# Patient Record
Sex: Female | Born: 1945
Health system: Southern US, Community
[De-identification: ages and names within clinical notes are randomized; demographics above are authoritative.]

## PROBLEM LIST (undated history)

## (undated) DIAGNOSIS — F329 Major depressive disorder, single episode, unspecified: Secondary | ICD-10-CM

## (undated) DIAGNOSIS — E119 Type 2 diabetes mellitus without complications: Secondary | ICD-10-CM

## (undated) DIAGNOSIS — F32A Depression, unspecified: Secondary | ICD-10-CM

## (undated) DIAGNOSIS — I10 Essential (primary) hypertension: Secondary | ICD-10-CM

## (undated) HISTORY — DX: Essential (primary) hypertension: I10

---

## 2002-03-08 HISTORY — PX: FRACTURE SURGERY: SHX138

## 2007-07-07 ENCOUNTER — Ambulatory Visit: Payer: Self-pay | Admitting: Internal Medicine

## 2009-10-08 ENCOUNTER — Ambulatory Visit: Payer: Self-pay | Admitting: Internal Medicine

## 2011-03-25 ENCOUNTER — Ambulatory Visit: Payer: Self-pay | Admitting: Internal Medicine

## 2012-03-08 HISTORY — PX: CHOLECYSTECTOMY: SHX55

## 2012-03-27 ENCOUNTER — Ambulatory Visit: Payer: Self-pay | Admitting: Physician Assistant

## 2013-04-04 ENCOUNTER — Ambulatory Visit: Payer: Self-pay | Admitting: Physician Assistant

## 2015-01-16 ENCOUNTER — Other Ambulatory Visit: Payer: Self-pay | Admitting: Internal Medicine

## 2015-01-16 DIAGNOSIS — R0989 Other specified symptoms and signs involving the circulatory and respiratory systems: Secondary | ICD-10-CM

## 2015-01-20 ENCOUNTER — Other Ambulatory Visit: Payer: Self-pay | Admitting: Internal Medicine

## 2015-01-20 DIAGNOSIS — R109 Unspecified abdominal pain: Secondary | ICD-10-CM

## 2015-01-23 ENCOUNTER — Ambulatory Visit
Admission: RE | Admit: 2015-01-23 | Discharge: 2015-01-23 | Disposition: A | Discharge status: Home / Self Care | Payer: Commercial Managed Care - HMO | Source: Ambulatory Visit | Attending: Internal Medicine | Admitting: Internal Medicine

## 2015-01-23 DIAGNOSIS — Z9049 Acquired absence of other specified parts of digestive tract: Secondary | ICD-10-CM | POA: Diagnosis not present

## 2015-01-23 DIAGNOSIS — R109 Unspecified abdominal pain: Secondary | ICD-10-CM | POA: Diagnosis present

## 2015-01-23 DIAGNOSIS — R0989 Other specified symptoms and signs involving the circulatory and respiratory systems: Secondary | ICD-10-CM

## 2015-05-13 DIAGNOSIS — Z124 Encounter for screening for malignant neoplasm of cervix: Secondary | ICD-10-CM | POA: Diagnosis not present

## 2015-05-13 DIAGNOSIS — Z23 Encounter for immunization: Secondary | ICD-10-CM | POA: Diagnosis not present

## 2015-05-13 DIAGNOSIS — Z0001 Encounter for general adult medical examination with abnormal findings: Secondary | ICD-10-CM | POA: Diagnosis not present

## 2015-05-13 DIAGNOSIS — K432 Incisional hernia without obstruction or gangrene: Secondary | ICD-10-CM | POA: Diagnosis not present

## 2015-05-13 DIAGNOSIS — E782 Mixed hyperlipidemia: Secondary | ICD-10-CM | POA: Diagnosis not present

## 2015-05-13 DIAGNOSIS — F329 Major depressive disorder, single episode, unspecified: Secondary | ICD-10-CM | POA: Diagnosis not present

## 2015-05-13 DIAGNOSIS — E119 Type 2 diabetes mellitus without complications: Secondary | ICD-10-CM | POA: Diagnosis not present

## 2015-05-19 DIAGNOSIS — Z1231 Encounter for screening mammogram for malignant neoplasm of breast: Secondary | ICD-10-CM | POA: Diagnosis not present

## 2015-06-09 DIAGNOSIS — J0191 Acute recurrent sinusitis, unspecified: Secondary | ICD-10-CM | POA: Diagnosis not present

## 2015-06-09 DIAGNOSIS — R05 Cough: Secondary | ICD-10-CM | POA: Diagnosis not present

## 2015-10-04 DIAGNOSIS — W010XXA Fall on same level from slipping, tripping and stumbling without subsequent striking against object, initial encounter: Secondary | ICD-10-CM | POA: Diagnosis not present

## 2015-10-04 DIAGNOSIS — Z885 Allergy status to narcotic agent status: Secondary | ICD-10-CM | POA: Diagnosis not present

## 2015-10-04 DIAGNOSIS — S52562A Barton's fracture of left radius, initial encounter for closed fracture: Secondary | ICD-10-CM | POA: Diagnosis not present

## 2015-10-04 DIAGNOSIS — S6992XA Unspecified injury of left wrist, hand and finger(s), initial encounter: Secondary | ICD-10-CM | POA: Diagnosis not present

## 2015-10-04 DIAGNOSIS — E119 Type 2 diabetes mellitus without complications: Secondary | ICD-10-CM | POA: Diagnosis not present

## 2015-10-04 DIAGNOSIS — S52572A Other intraarticular fracture of lower end of left radius, initial encounter for closed fracture: Secondary | ICD-10-CM | POA: Diagnosis not present

## 2015-10-04 DIAGNOSIS — W109XXA Fall (on) (from) unspecified stairs and steps, initial encounter: Secondary | ICD-10-CM | POA: Diagnosis not present

## 2015-10-05 DIAGNOSIS — S52572A Other intraarticular fracture of lower end of left radius, initial encounter for closed fracture: Secondary | ICD-10-CM | POA: Diagnosis not present

## 2015-10-05 DIAGNOSIS — W109XXA Fall (on) (from) unspecified stairs and steps, initial encounter: Secondary | ICD-10-CM | POA: Diagnosis not present

## 2015-10-05 DIAGNOSIS — S52562A Barton's fracture of left radius, initial encounter for closed fracture: Secondary | ICD-10-CM | POA: Diagnosis not present

## 2015-10-05 DIAGNOSIS — E119 Type 2 diabetes mellitus without complications: Secondary | ICD-10-CM | POA: Diagnosis not present

## 2015-10-06 ENCOUNTER — Other Ambulatory Visit: Payer: Self-pay | Admitting: Specialist

## 2015-10-06 ENCOUNTER — Ambulatory Visit
Admission: RE | Admit: 2015-10-06 | Discharge: 2015-10-06 | Disposition: A | Discharge status: Home / Self Care | Payer: PPO | Source: Ambulatory Visit | Attending: Specialist | Admitting: Specialist

## 2015-10-06 ENCOUNTER — Ambulatory Visit: Payer: PPO | Admitting: Certified Registered"

## 2015-10-06 ENCOUNTER — Encounter
Admission: RE | Disposition: A | Discharge status: Home / Self Care | Payer: Self-pay | Source: Ambulatory Visit | Attending: Specialist

## 2015-10-06 DIAGNOSIS — S52562A Barton's fracture of left radius, initial encounter for closed fracture: Secondary | ICD-10-CM | POA: Insufficient documentation

## 2015-10-06 DIAGNOSIS — X58XXXA Exposure to other specified factors, initial encounter: Secondary | ICD-10-CM | POA: Diagnosis not present

## 2015-10-06 DIAGNOSIS — S52542A Smith's fracture of left radius, initial encounter for closed fracture: Secondary | ICD-10-CM | POA: Diagnosis not present

## 2015-10-06 DIAGNOSIS — Y939 Activity, unspecified: Secondary | ICD-10-CM | POA: Insufficient documentation

## 2015-10-06 DIAGNOSIS — Z87891 Personal history of nicotine dependence: Secondary | ICD-10-CM | POA: Insufficient documentation

## 2015-10-06 DIAGNOSIS — S52502A Unspecified fracture of the lower end of left radius, initial encounter for closed fracture: Secondary | ICD-10-CM | POA: Diagnosis not present

## 2015-10-06 DIAGNOSIS — E119 Type 2 diabetes mellitus without complications: Secondary | ICD-10-CM | POA: Insufficient documentation

## 2015-10-06 HISTORY — PX: OPEN REDUCTION INTERNAL FIXATION (ORIF) DISTAL RADIAL FRACTURE: SHX5989

## 2015-10-06 HISTORY — DX: Type 2 diabetes mellitus without complications: E11.9

## 2015-10-06 LAB — GLUCOSE, CAPILLARY
GLUCOSE-CAPILLARY: 105 mg/dL — AB (ref 65–99)
GLUCOSE-CAPILLARY: 105 mg/dL — AB (ref 65–99)

## 2015-10-06 SURGERY — OPEN REDUCTION INTERNAL FIXATION (ORIF) DISTAL RADIUS FRACTURE
Anesthesia: Choice | Laterality: Left

## 2015-10-06 SURGERY — OPEN REDUCTION INTERNAL FIXATION (ORIF) DISTAL RADIUS FRACTURE
Anesthesia: General | Site: Wrist | Laterality: Left | Wound class: Clean

## 2015-10-06 MED ORDER — MELOXICAM 15 MG PO TABS: 15.0000 mg | ORAL_TABLET | Freq: Every day | ORAL | 3 refills | Status: DC

## 2015-10-06 MED ORDER — GLYCOPYRROLATE 0.2 MG/ML IJ SOLN
INTRAMUSCULAR | Status: DC | PRN
  Administered 2015-10-06: 0.2 mg via INTRAVENOUS

## 2015-10-06 MED ORDER — HYDROCODONE-ACETAMINOPHEN 5-325 MG PO TABS
ORAL_TABLET | ORAL | Status: AC
  Administered 2015-10-06: 1 via ORAL
  Filled 2015-10-06: qty 1

## 2015-10-06 MED ORDER — HYDROCODONE-ACETAMINOPHEN 5-325 MG PO TABS
1.0000 | ORAL_TABLET | Freq: Four times a day (QID) | ORAL | Status: AC | PRN
  Administered 2015-10-06: 1 via ORAL

## 2015-10-06 MED ORDER — HYDROCODONE-ACETAMINOPHEN 5-325 MG PO TABS: 1.0000 | ORAL_TABLET | Freq: Four times a day (QID) | ORAL | 0 refills | Status: DC | PRN

## 2015-10-06 MED ORDER — CHLORHEXIDINE GLUCONATE 4 % EX LIQD
1.0000 "application " | Freq: Once | CUTANEOUS | Status: AC
  Administered 2015-10-06: 1 via TOPICAL

## 2015-10-06 MED ORDER — ONDANSETRON HCL 4 MG/2ML IJ SOLN: 4.0000 mg | Freq: Once | INTRAMUSCULAR | Status: DC | PRN

## 2015-10-06 MED ORDER — MIDAZOLAM HCL 2 MG/2ML IJ SOLN
INTRAMUSCULAR | Status: DC | PRN
  Administered 2015-10-06: 2 mg via INTRAVENOUS

## 2015-10-06 MED ORDER — CLINDAMYCIN PHOSPHATE 600 MG/50ML IV SOLN
INTRAVENOUS | Status: AC
  Filled 2015-10-06: qty 50

## 2015-10-06 MED ORDER — LIDOCAINE HCL (CARDIAC) 20 MG/ML IV SOLN
INTRAVENOUS | Status: DC | PRN
  Administered 2015-10-06: 60 mg via INTRAVENOUS

## 2015-10-06 MED ORDER — NEOMYCIN-POLYMYXIN B GU 40-200000 IR SOLN
Status: AC
  Filled 2015-10-06: qty 2

## 2015-10-06 MED ORDER — BUPIVACAINE HCL (PF) 0.5 % IJ SOLN
INTRAMUSCULAR | Status: AC
  Filled 2015-10-06: qty 30

## 2015-10-06 MED ORDER — GABAPENTIN 400 MG PO CAPS: 400.0000 mg | ORAL_CAPSULE | Freq: Three times a day (TID) | ORAL | 3 refills | Status: DC

## 2015-10-06 MED ORDER — CLINDAMYCIN PHOSPHATE 600 MG/50ML IV SOLN
600.0000 mg | Freq: Once | INTRAVENOUS | Status: AC
  Administered 2015-10-06: 600 mg via INTRAVENOUS

## 2015-10-06 MED ORDER — BUPIVACAINE HCL (PF) 0.5 % IJ SOLN
INTRAMUSCULAR | Status: DC | PRN
  Administered 2015-10-06: 30 mL

## 2015-10-06 MED ORDER — ACETAMINOPHEN 10 MG/ML IV SOLN
INTRAVENOUS | Status: DC | PRN
  Administered 2015-10-06: 1000 mg via INTRAVENOUS

## 2015-10-06 MED ORDER — SODIUM CHLORIDE 0.9 % IV SOLN
INTRAVENOUS | Status: DC
  Administered 2015-10-06: 12:00:00 via INTRAVENOUS

## 2015-10-06 MED ORDER — FENTANYL CITRATE (PF) 100 MCG/2ML IJ SOLN: 25.0000 ug | INTRAMUSCULAR | Status: DC | PRN

## 2015-10-06 MED ORDER — PROPOFOL 10 MG/ML IV BOLUS
INTRAVENOUS | Status: DC | PRN
  Administered 2015-10-06: 110 mg via INTRAVENOUS

## 2015-10-06 MED ORDER — ONDANSETRON HCL 4 MG/2ML IJ SOLN
INTRAMUSCULAR | Status: DC | PRN
  Administered 2015-10-06: 4 mg via INTRAVENOUS

## 2015-10-06 MED ORDER — CEFAZOLIN SODIUM-DEXTROSE 2-4 GM/100ML-% IV SOLN
INTRAVENOUS | Status: AC
  Filled 2015-10-06: qty 100

## 2015-10-06 MED ORDER — ACETAMINOPHEN 10 MG/ML IV SOLN
INTRAVENOUS | Status: AC
  Filled 2015-10-06: qty 100

## 2015-10-06 MED ORDER — PHENYLEPHRINE HCL 10 MG/ML IJ SOLN: INTRAMUSCULAR | Status: DC | PRN

## 2015-10-06 MED ORDER — NEOMYCIN-POLYMYXIN B GU 40-200000 IR SOLN
Status: DC | PRN
  Administered 2015-10-06: 2 mL

## 2015-10-06 MED ORDER — FENTANYL CITRATE (PF) 100 MCG/2ML IJ SOLN
INTRAMUSCULAR | Status: DC | PRN
  Administered 2015-10-06: 50 ug via INTRAVENOUS
  Administered 2015-10-06 (×3): 25 ug via INTRAVENOUS

## 2015-10-06 MED ORDER — CEFAZOLIN SODIUM-DEXTROSE 2-4 GM/100ML-% IV SOLN
2.0000 g | INTRAVENOUS | Status: AC
  Administered 2015-10-06: 2 g via INTRAVENOUS

## 2015-10-06 SURGICAL SUPPLY — 42 items
2.5MM GOLD DRILL BIT ×2 IMPLANT
BIT DRILL 2 FAST STEP (BIT) ×2 IMPLANT
BIT DRILL 2.5X4 QC (BIT) ×2 IMPLANT
BLADE SURG MINI STRL (BLADE) ×2 IMPLANT
BNDG COHESIVE 4X5 TAN STRL (GAUZE/BANDAGES/DRESSINGS) IMPLANT
BNDG ESMARK 4X12 TAN STRL LF (GAUZE/BANDAGES/DRESSINGS) ×2 IMPLANT
CANISTER SUCT 1200ML W/VALVE (MISCELLANEOUS) ×2 IMPLANT
CHLORAPREP W/TINT 26ML (MISCELLANEOUS) ×2 IMPLANT
CUFF TOURN 18 STER (MISCELLANEOUS) ×2 IMPLANT
DRAPE FLUOR MINI C-ARM 54X84 (DRAPES) ×2 IMPLANT
ELECT REM PT RETURN 9FT ADLT (ELECTROSURGICAL) ×2
ELECTRODE REM PT RTRN 9FT ADLT (ELECTROSURGICAL) ×1 IMPLANT
GAUZE FLUFF 18X24 1PLY STRL (GAUZE/BANDAGES/DRESSINGS) ×2 IMPLANT
GAUZE PETRO XEROFOAM 1X8 (MISCELLANEOUS) ×2 IMPLANT
GAUZE SPONGE 4X4 12PLY STRL (GAUZE/BANDAGES/DRESSINGS) ×2 IMPLANT
GLOVE INDICATOR 8.0 STRL GRN (GLOVE) ×2 IMPLANT
GLOVE SURG ORTHO 8.5 STRL (GLOVE) ×2 IMPLANT
GOWN STRL REUS W/ TWL LRG LVL3 (GOWN DISPOSABLE) ×2 IMPLANT
GOWN STRL REUS W/TWL LRG LVL3 (GOWN DISPOSABLE) ×2
K-WIRE 1.6 (WIRE) ×1
K-WIRE FX5X1.6XNS BN SS (WIRE) ×1
KIT RM TURNOVER STRD PROC AR (KITS) ×2 IMPLANT
KWIRE FX5X1.6XNS BN SS (WIRE) ×1 IMPLANT
NDL SAFETY 18GX1.5 (NEEDLE) ×2 IMPLANT
NS IRRIG 500ML POUR BTL (IV SOLUTION) ×2 IMPLANT
PACK EXTREMITY ARMC (MISCELLANEOUS) ×2 IMPLANT
PADDING CAST 4IN STRL (MISCELLANEOUS) ×2
PADDING CAST BLEND 4X4 STRL (MISCELLANEOUS) ×2 IMPLANT
PEG SUBCHONDRAL SMOOTH 2.0X16 (Peg) ×4 IMPLANT
PEG SUBCHONDRAL SMOOTH 2.0X18 (Peg) ×2 IMPLANT
PLATE SHORT 24.4X51.3 LT (Plate) ×2 IMPLANT
SCREW CORT 3.5X10 LNG (Screw) ×6 IMPLANT
SCREW PEG LOCK 2.5X18 (Peg) ×2 IMPLANT
SCREW PEG LOCK 2.5X20 (Peg) ×2 IMPLANT
SPLINT CAST 1 STEP 3X12 (MISCELLANEOUS) IMPLANT
SPLINT CAST 1 STEP 4X15 (MISCELLANEOUS) ×2 IMPLANT
SPONGE LAP 18X18 5 PK (GAUZE/BANDAGES/DRESSINGS) IMPLANT
STAPLER SKIN PROX 35W (STAPLE) ×2 IMPLANT
STOCKINETTE BIAS CUT 4 980044 (GAUZE/BANDAGES/DRESSINGS) ×2 IMPLANT
STOCKINETTE STRL 4IN 9604848 (GAUZE/BANDAGES/DRESSINGS) ×2 IMPLANT
SUT VIC AB 3-0 SH 27 (SUTURE) ×1
SUT VIC AB 3-0 SH 27X BRD (SUTURE) ×1 IMPLANT

## 2015-10-06 NOTE — Anesthesia Postprocedure Evaluation (Signed)
Anesthesia Post Note  Patient: Bonnie Washington  Procedure(s) Performed: Procedure(s) (LRB): OPEN REDUCTION INTERNAL FIXATION (ORIF) DISTAL RADIAL FRACTURE (Left)  Patient location during evaluation: PACU Anesthesia Type: General Level of consciousness: awake and alert Pain management: pain level controlled Vital Signs Assessment: post-procedure vital signs reviewed and stable Respiratory status: spontaneous breathing, nonlabored ventilation, respiratory function stable and patient connected to nasal cannula oxygen Cardiovascular status: blood pressure returned to baseline and stable Postop Assessment: no signs of nausea or vomiting Anesthetic complications: no    Last Vitals:  Vitals:   10/06/15 1549 10/06/15 1600  BP: (!) 169/74 (!) 151/70  Pulse: 75 75  Resp: 16   Temp: (!) 35.8 C     Last Pain:  Vitals:   10/06/15 1549  TempSrc: Tympanic  PainSc: [place]

## 2015-10-06 NOTE — Transfer of Care (Signed)
Immediate Anesthesia Transfer of Care Note  Patient: Bonnie Washington  Procedure(s) Performed: Procedure(s): OPEN REDUCTION INTERNAL FIXATION (ORIF) DISTAL RADIAL FRACTURE (Left)  Patient Location: PACU  Anesthesia Type:General  Level of Consciousness: sedated and responds to stimulation  Airway & Oxygen Therapy: Patient Spontanous Breathing and Patient connected to face mask oxygen  Post-op Assessment: Report given to RN and Post -op Vital signs reviewed and stable  Post vital signs: Reviewed and stable  Last Vitals:  Vitals:   10/06/15 1142 10/06/15 1454  BP: (!) 163/78 (!) 158/91  Pulse: 66 97  Resp: 16 (!) 22  Temp: 36.2 C     Last Pain:  Vitals:   10/06/15 1142  TempSrc: Tympanic  PainSc: 10-Worst pain ever         Complications: No apparent anesthesia complications

## 2015-10-06 NOTE — Discharge Instructions (Signed)

## 2015-10-06 NOTE — Anesthesia Procedure Notes (Signed)
Procedure Name: LMA Insertion Performed by: Cono Gebhard Pre-anesthesia Checklist: Patient identified, Patient being monitored, Timeout performed, Emergency Drugs available and Suction available Patient Re-evaluated:Patient Re-evaluated prior to inductionOxygen Delivery Method: Circle system utilized Preoxygenation: Pre-oxygenation with 100% oxygen Intubation Type: IV induction LMA: LMA inserted LMA Size: 3.0 Tube type: Oral Number of attempts: 1 Placement Confirmation: positive ETCO2 and breath sounds checked- equal and bilateral Tube secured with: Tape Dental Injury: Teeth and Oropharynx as per pre-operative assessment        

## 2015-10-06 NOTE — Anesthesia Preprocedure Evaluation (Signed)
Anesthesia Evaluation  Patient identified by MRN, date of birth, ID band Patient awake    Reviewed: Allergy & Precautions, NPO status , Patient's Chart, lab work & pertinent test results, reviewed documented beta blocker date and time   Airway Mallampati: II  TM Distance: >3 FB     Dental  (+) Chipped   Pulmonary former smoker,           Cardiovascular      Neuro/Psych    GI/Hepatic   Endo/Other  diabetes  Renal/GU      Musculoskeletal   Abdominal   Peds  Hematology   Anesthesia Other Findings   Reproductive/Obstetrics                             Anesthesia Physical Anesthesia Plan  ASA: II  Anesthesia Plan: General   Post-op Pain Management:    Induction: Intravenous  Airway Management Planned: LMA  Additional Equipment:   Intra-op Plan:   Post-operative Plan:   Informed Consent: I have reviewed the patients History and Physical, chart, labs and discussed the procedure including the risks, benefits and alternatives for the proposed anesthesia with the patient or authorized representative who has indicated his/her understanding and acceptance.     Plan Discussed with: CRNA  Anesthesia Plan Comments:         Anesthesia Quick Evaluation

## 2015-10-06 NOTE — Op Note (Signed)
10/06/2015  2:47 PM  PATIENT:  Bonnie Washington    PRE-OPERATIVE DIAGNOSIS:  WRIST FRACTURE--DISPLACED BARTON'S LEFT     POST-OPERATIVE DIAGNOSIS:  Same  PROCEDURE:  OPEN REDUCTION INTERNAL FIXATION (ORIF) DISTAL RADIAL FRACTURE LEFT  SURGEON:  Valinda Hoar, MD  ANESTHESIA:   General LMA  PREOPERATIVE INDICATIONS:  Bonnie Washington is a  70 y.o. female with a diagnosis of WRIST FRACTURE who failed conservative measures and elected for surgical management.    The risks benefits and alternatives were discussed with the patient preoperatively including but not limited to the risks of infection, bleeding, nerve injury, malunion, nonunion, wrist stiffness, persistent wrist pain, osteoarthritis and the need for further surgery. Medical risks include but are not limited to DVT and pulmonary embolism, myocardial infarction, stroke, pneumonia, respiratory failure and death. Patient  understood these risks and wished to proceed.   OPERATIVE IMPLANTS: [employer] hand innovations plate, 3 hole  OPERATIVE FINDINGS: Displaced, split volar radius fracture  OPERATIVE PROCEDURE: Patient was seen in the preoperative area. I marked the operative hand with the word yes and my initials according the hospital's correct site of surgery protocol. Patient was then brought to the operating roomand was placed supine on the operative table and underwent general anesthesia with an LMA.   The operative arm was prepped and draped in a sterile fashion. A timeout performed to verify the patient's name, date of birth, medical record number, correct site of surgery correct procedure to be performed. The timeout was also used a timeout to verify patient received antibiotics and appropriate instruments, implants and radiographs studies were available in the room. Once all in attendance were in agreement case began.   Patient then had the operative extremity exsanguinated with an Esmarch. The tourniquet was placed on the upper  extremity and inflated 250 mm.  A manual reduction of the fracture was performed. The fracture reduction was confirmed on FluoroScan imaging.  A linear incision was then made over the FCR tendon. The subcutaneous tissue was carefully dissected using Metzenbaum scissor and Adson pickup. Retractors were used to protect the radial artery and median nerve. The pronator quadratus was identified and incised and elevated off the volar surface of the distal radius. A 3  hole [employer]ations volar plate was then positioned on the under surface of the distal radius. It was held into position with a K wire. The position of the plate was confirmed on AP and lateral images. Once the plate was in good position a 10 mm shaft screw was placed bicortically. Attention was then turned to the distal pegs. The proximal row of pegs was placed first. Each individual peg hole was drilled and then measured with a depth gauge. The proximal row had 2 threaded pegs placed. The distal row was then drilled and smooth pegs were placed. The position and length of all screws were confirmed on AP and lateral FluoroScan imaging. Care was taken to avoid penetration of any peg through the articular surface of the distal radius.  Once all distal pegs were placed, the attention was turned back to placement of bicortical shaft screws. 2 additional screws were placed in the plate, for a total of 3 bicortical shaft screws. The wound was then copiously irrigated. Final FluoroScan imaging of the construct were taken. The fracture was in anatomic position and the hardware was well-positioned. The wound again was copiously irrigated. The soft tissue was then carefully over the plate. The tissues were infiltrated with 1/2% marcaine.  The skin  was closed with staples. Xeroform and a dry sterile dressing were applied along with a volar splint. Tourniquet was deflated with good return of blood flow to all fingers. I was scrubbed and present for the entire case  and all sharp and instrument counts were correct at the conclusion the case. The patient tolerated this procedure well and was awakened and taken to the recovery room in good condition.   Earnestine Leys, MD

## 2015-10-06 NOTE — Progress Notes (Signed)
Circulation positive to left hand   Can wiggle fingers   Warm to touch

## 2015-10-06 NOTE — H&P (Signed)
THE PATIENT WAS SEEN PRIOR TO SURGERY TODAY.  HISTORY, ALLERGIES, HOME MEDICATIONS AND OPERATIVE PROCEDURE WERE REVIEWED. RISKS AND BENEFITS OF SURGERY DISCUSSED WITH PATIENT AGAIN.  NO CHANGES FROM INITIAL HISTORY AND PHYSICAL NOTED.    

## 2015-10-09 DIAGNOSIS — S52542D Smith's fracture of left radius, subsequent encounter for closed fracture with routine healing: Secondary | ICD-10-CM | POA: Diagnosis not present

## 2015-10-20 DIAGNOSIS — S52542D Smith's fracture of left radius, subsequent encounter for closed fracture with routine healing: Secondary | ICD-10-CM | POA: Diagnosis not present

## 2015-11-05 DIAGNOSIS — M25632 Stiffness of left wrist, not elsewhere classified: Secondary | ICD-10-CM | POA: Diagnosis not present

## 2015-11-05 DIAGNOSIS — M25532 Pain in left wrist: Secondary | ICD-10-CM | POA: Diagnosis not present

## 2015-11-11 DIAGNOSIS — M25632 Stiffness of left wrist, not elsewhere classified: Secondary | ICD-10-CM | POA: Diagnosis not present

## 2015-11-11 DIAGNOSIS — M25532 Pain in left wrist: Secondary | ICD-10-CM | POA: Diagnosis not present

## 2015-11-13 DIAGNOSIS — M25532 Pain in left wrist: Secondary | ICD-10-CM | POA: Diagnosis not present

## 2015-11-13 DIAGNOSIS — M25632 Stiffness of left wrist, not elsewhere classified: Secondary | ICD-10-CM | POA: Diagnosis not present

## 2015-11-18 DIAGNOSIS — M25532 Pain in left wrist: Secondary | ICD-10-CM | POA: Diagnosis not present

## 2015-11-18 DIAGNOSIS — M25632 Stiffness of left wrist, not elsewhere classified: Secondary | ICD-10-CM | POA: Diagnosis not present

## 2015-11-20 DIAGNOSIS — M25632 Stiffness of left wrist, not elsewhere classified: Secondary | ICD-10-CM | POA: Diagnosis not present

## 2015-11-20 DIAGNOSIS — M25532 Pain in left wrist: Secondary | ICD-10-CM | POA: Diagnosis not present

## 2015-11-25 DIAGNOSIS — M25532 Pain in left wrist: Secondary | ICD-10-CM | POA: Diagnosis not present

## 2015-11-25 DIAGNOSIS — M25632 Stiffness of left wrist, not elsewhere classified: Secondary | ICD-10-CM | POA: Diagnosis not present

## 2015-12-08 DIAGNOSIS — M810 Age-related osteoporosis without current pathological fracture: Secondary | ICD-10-CM | POA: Diagnosis not present

## 2015-12-08 DIAGNOSIS — E119 Type 2 diabetes mellitus without complications: Secondary | ICD-10-CM | POA: Diagnosis not present

## 2015-12-08 DIAGNOSIS — R05 Cough: Secondary | ICD-10-CM | POA: Diagnosis not present

## 2015-12-08 DIAGNOSIS — I10 Essential (primary) hypertension: Secondary | ICD-10-CM | POA: Diagnosis not present

## 2015-12-08 DIAGNOSIS — F329 Major depressive disorder, single episode, unspecified: Secondary | ICD-10-CM | POA: Diagnosis not present

## 2015-12-08 DIAGNOSIS — S52562D Barton's fracture of left radius, subsequent encounter for closed fracture with routine healing: Secondary | ICD-10-CM | POA: Diagnosis not present

## 2015-12-08 DIAGNOSIS — Z23 Encounter for immunization: Secondary | ICD-10-CM | POA: Diagnosis not present

## 2015-12-08 DIAGNOSIS — K432 Incisional hernia without obstruction or gangrene: Secondary | ICD-10-CM | POA: Diagnosis not present

## 2015-12-10 ENCOUNTER — Encounter: Payer: Self-pay | Admitting: General Surgery

## 2015-12-22 ENCOUNTER — Ambulatory Visit: Payer: Self-pay | Admitting: General Surgery

## 2016-01-05 ENCOUNTER — Ambulatory Visit: Payer: Self-pay | Admitting: General Surgery

## 2016-01-06 DIAGNOSIS — M25532 Pain in left wrist: Secondary | ICD-10-CM | POA: Diagnosis not present

## 2016-01-08 ENCOUNTER — Encounter: Payer: Self-pay | Admitting: *Deleted

## 2016-05-25 DIAGNOSIS — R6883 Chills (without fever): Secondary | ICD-10-CM | POA: Diagnosis not present

## 2016-05-25 DIAGNOSIS — J209 Acute bronchitis, unspecified: Secondary | ICD-10-CM | POA: Diagnosis not present

## 2016-06-03 DIAGNOSIS — J45991 Cough variant asthma: Secondary | ICD-10-CM | POA: Diagnosis not present

## 2016-06-03 DIAGNOSIS — J019 Acute sinusitis, unspecified: Secondary | ICD-10-CM | POA: Diagnosis not present

## 2016-09-13 DIAGNOSIS — Z0001 Encounter for general adult medical examination with abnormal findings: Secondary | ICD-10-CM | POA: Diagnosis not present

## 2016-10-14 DIAGNOSIS — F329 Major depressive disorder, single episode, unspecified: Secondary | ICD-10-CM | POA: Diagnosis not present

## 2016-10-14 DIAGNOSIS — I10 Essential (primary) hypertension: Secondary | ICD-10-CM | POA: Diagnosis not present

## 2017-01-12 DIAGNOSIS — E119 Type 2 diabetes mellitus without complications: Secondary | ICD-10-CM | POA: Diagnosis not present

## 2017-01-12 DIAGNOSIS — K432 Incisional hernia without obstruction or gangrene: Secondary | ICD-10-CM | POA: Diagnosis not present

## 2017-01-12 DIAGNOSIS — I10 Essential (primary) hypertension: Secondary | ICD-10-CM | POA: Diagnosis not present

## 2017-02-04 DIAGNOSIS — L821 Other seborrheic keratosis: Secondary | ICD-10-CM | POA: Diagnosis not present

## 2017-04-14 ENCOUNTER — Ambulatory Visit (INDEPENDENT_AMBULATORY_CARE_PROVIDER_SITE_OTHER): Payer: PPO | Admitting: Nurse Practitioner

## 2017-04-14 ENCOUNTER — Encounter: Payer: Self-pay | Admitting: Nurse Practitioner

## 2017-04-14 VITALS — BP 130/80 | HR 75 | Resp 16 | Ht 64.0 in | Wt 149.0 lb

## 2017-04-14 DIAGNOSIS — E119 Type 2 diabetes mellitus without complications: Secondary | ICD-10-CM

## 2017-04-14 DIAGNOSIS — I10 Essential (primary) hypertension: Secondary | ICD-10-CM

## 2017-04-14 DIAGNOSIS — K439 Ventral hernia without obstruction or gangrene: Secondary | ICD-10-CM | POA: Diagnosis not present

## 2017-04-14 NOTE — Progress Notes (Signed)
[facility] [place], [place] [address]  Internal MEDICINE  Office Visit Note  Patient Name: Bonnie Washington  [id number]  [id number]  Date of Service: 04/21/2017  Chief Complaint  Patient presents with  . Hypertension  . Incisional Hernia    adjacent to incision from gallbladder surgical scar. Getting bigger. starting to bother her, causign pain intermittently.     The patent is here for routine follow up exam. She is c/o hernia in her abdomen, adjacent to gallbladder surgical scar. Hurts most of the time. When straining or sitting up, causes increased pain .   Hypertension  This is a chronic problem. The current episode started more than 1 year ago. The problem has been gradually improving since onset. The problem is controlled. Pertinent negatives include no chest pain, neck pain, palpitations or shortness of breath. There are no associated agents to hypertension. Risk factors for coronary artery disease include post-menopausal state. Past treatments include diuretics. The current treatment provides moderate improvement. There are no compliance problems.     Pt is here for routine follow up.    Current Medication: Outpatient Encounter Medications as of 04/14/2017  Medication Sig  . atorvastatin (LIPITOR) 20 MG tablet atorvastatin 20 mg tablet  . fluticasone (FLONASE) 50 MCG/ACT nasal spray fluticasone 50 mcg/actuation nasal spray,suspension  . hydrochlorothiazide (MICROZIDE) 12.5 MG capsule TAKE ONE CAPSULE BY MOUTH EVERY DAY FOR BLOOD PRESSURE  . venlafaxine XR (EFFEXOR-XR) 150 MG 24 hr capsule Take 150 mg by mouth daily.  . [DISCONTINUED] gabapentin (NEURONTIN) 400 MG capsule Take 1 capsule (400 mg total) by mouth 3 (three) times daily.  . [DISCONTINUED] aspirin 81 MG tablet Take 81 mg by mouth every Monday, Wednesday, and Friday.  . [DISCONTINUED] HYDROcodone-acetaminophen (NORCO) 5-325 MG tablet Take 1-2 tablets by mouth every 6 (six) hours as needed.  .  [DISCONTINUED] meloxicam (MOBIC) 15 MG tablet Take 1 tablet (15 mg total) by mouth daily.  . [DISCONTINUED] metFORMIN (GLUCOPHAGE) 500 MG tablet Take by mouth 1 day or 1 dose. Patient does not know dosage  . [DISCONTINUED] oxyCODONE-acetaminophen (PERCOCET/ROXICET) 5-325 MG tablet Take 1 tablet by mouth every 4 (four) hours as needed for severe pain.  . [DISCONTINUED] venlafaxine (EFFEXOR) 100 MG tablet Take by mouth every other day.   No facility-administered encounter medications on file as of 04/14/2017.     Surgical History: Past Surgical History:  Procedure Laterality Date  . CHOLECYSTECTOMY    . FRACTURE SURGERY Right    elbow  . OPEN REDUCTION INTERNAL FIXATION (ORIF) DISTAL RADIAL FRACTURE Left 10/06/2015   Procedure: OPEN REDUCTION INTERNAL FIXATION (ORIF) DISTAL RADIAL FRACTURE;  Surgeon: Earnestine Leys, MD;  Location: [facility] ORS;  Service: Orthopedics;  Laterality: Left;    Medical History: Past Medical History:  Diagnosis Date  . Diabetes mellitus without complication ([place])   . Hypertension     Family History: Family History  Problem Relation Age of Onset  . Hypertension Mother     Social History   Socioeconomic History  . Marital status: Married    Spouse name: Not on file  . Number of children: Not on file  . Years of education: Not on file  . Highest education level: Not on file  Social Needs  . Financial resource strain: Not on file  . Food insecurity - worry: Not on file  . Food insecurity - inability: Not on file  . Transportation needs - medical: Not on file  . Transportation needs - non-medical: Not on  file  Occupational History  . Not on file  Tobacco Use  . Smoking status: Former Research scientist (life sciences)  . Smokeless tobacco: Never Used  Substance and Sexual Activity  . Alcohol use: Yes    Alcohol/week: 2.4 oz    Types: 4 Cans of beer per week  . Drug use: No  . Sexual activity: Not on file  Other Topics Concern  . Not on file  Social History Narrative  . Not  on file      Review of Systems  Constitutional: Negative for activity change, chills, fatigue and unexpected weight change.  HENT: Negative for congestion, postnasal drip, rhinorrhea, sneezing and sore throat.   Eyes: Negative.  Negative for redness.  Respiratory: Negative for cough, chest tightness, shortness of breath and wheezing.   Cardiovascular: Negative for chest pain and palpitations.  Gastrointestinal: Positive for abdominal pain. Negative for constipation, diarrhea, nausea and vomiting.       Abdominal hernia, adjacent to surgical scar from gallbladder removal. Getting larger and starting to cause pain more often.   Endocrine: Negative for cold intolerance, heat intolerance, polydipsia, polyphagia and polyuria.  Genitourinary: Negative for dysuria and frequency.  Musculoskeletal: Negative for arthralgias, back pain, joint swelling and neck pain.  Skin: Negative for color change, pallor, rash and wound.  Allergic/Immunologic: Negative for environmental allergies, food allergies and immunocompromised state.  Neurological: Negative.  Negative for tremors and numbness.  Hematological: Negative for adenopathy. Does not bruise/bleed easily.  Psychiatric/Behavioral: Negative for behavioral problems (Depression), sleep disturbance and suicidal ideas. The patient is not nervous/anxious.     Today's Vitals   04/14/17 1025  BP: 130/80  Pulse: 75  Resp: 16  SpO2: 98%  Weight: 149 lb (67.6 kg)  Height: 5\' 4"  (1.626 m)    Physical Exam  Constitutional: She is oriented to person, place, and time. She appears well-developed and well-nourished.  HENT:  Head: Normocephalic.  Eyes: Pupils are equal, round, and reactive to light.  Neck: Normal range of motion. Neck supple. Carotid bruit is not present.  Cardiovascular: Normal rate, regular rhythm and normal heart sounds.  Pulmonary/Chest: Effort normal and breath sounds normal. She has no wheezes.  Abdominal: Soft. Bowel sounds are  normal. She exhibits mass. There is tenderness.    Musculoskeletal: Normal range of motion.  Neurological: She is alert and oriented to person, place, and time.  Skin: Skin is warm and dry.  Psychiatric: She has a normal mood and affect. Her behavior is normal. Judgment and thought content normal.  Nursing note and vitals reviewed.   Assessment/Plan: 1. Ventral hernia without obstruction or gangrene Getting larger and causing pain - Ambulatory referral to General Surgery  2. Essential hypertension Improved continue with higher dose of bp medication. Will monitor closely   3. Diabetes mellitus without complication ([place]) Controlled through diet.   General Counseling: Nakiyah verbalizes understanding of the findings of todays visit and agrees with plan of treatment. I have discussed any further diagnostic evaluation that may be needed or ordered today. We also reviewed her medications today. she has been encouraged to call the office with any questions or concerns that should arise related to todays visit.   Orders Placed This Encounter  Procedures  . Ambulatory referral to General Surgery      Time spent: [place] [facility]

## 2017-04-15 DIAGNOSIS — I10 Essential (primary) hypertension: Secondary | ICD-10-CM | POA: Insufficient documentation

## 2017-04-15 DIAGNOSIS — E1169 Type 2 diabetes mellitus with other specified complication: Secondary | ICD-10-CM | POA: Insufficient documentation

## 2017-04-15 DIAGNOSIS — E119 Type 2 diabetes mellitus without complications: Secondary | ICD-10-CM | POA: Insufficient documentation

## 2017-04-21 ENCOUNTER — Encounter: Payer: Self-pay | Admitting: Nurse Practitioner

## 2017-04-25 ENCOUNTER — Encounter: Payer: Self-pay | Admitting: *Deleted

## 2017-05-17 ENCOUNTER — Ambulatory Visit: Payer: Self-pay | Admitting: General Surgery

## 2017-05-18 ENCOUNTER — Encounter: Payer: Self-pay | Admitting: General Surgery

## 2017-05-18 ENCOUNTER — Ambulatory Visit (INDEPENDENT_AMBULATORY_CARE_PROVIDER_SITE_OTHER): Payer: PPO | Admitting: General Surgery

## 2017-05-18 VITALS — BP 163/78 | HR 93 | Resp 12 | Ht 64.0 in | Wt 151.0 lb

## 2017-05-18 DIAGNOSIS — K439 Ventral hernia without obstruction or gangrene: Secondary | ICD-10-CM | POA: Insufficient documentation

## 2017-05-18 HISTORY — DX: Ventral hernia without obstruction or gangrene: K43.9

## 2017-05-18 NOTE — Progress Notes (Signed)
Patient ID: Bonnie Washington, female   DOB: 20-Aug-1945, 72 y.o.   MRN: [id number]  Chief Complaint  Patient presents with  . Other    HPI Bonnie Washington is a 72 y.o. female here today for a evaluation of a abdominal wall hernia. Patient states she noticed this area about two years ago. In the the last year the area has got bigger. Pain with activity and went she bends over. Pops in and out.  Moves her bowels daily. HPI  Past Medical History:  Diagnosis Date  . Diabetes mellitus without complication ([place])   . Hypertension     Past Surgical History:  Procedure Laterality Date  . CHOLECYSTECTOMY    . FRACTURE SURGERY Right    elbow  . OPEN REDUCTION INTERNAL FIXATION (ORIF) DISTAL RADIAL FRACTURE Left 10/06/2015   Procedure: OPEN REDUCTION INTERNAL FIXATION (ORIF) DISTAL RADIAL FRACTURE;  Surgeon: Earnestine Leys, MD;  Location: [facility] ORS;  Service: Orthopedics;  Laterality: Left;    Family History  Problem Relation Age of Onset  . Hypertension Mother     Social History Social History   Tobacco Use  . Smoking status: Former Research scientist (life sciences)  . Smokeless tobacco: Never Used  Substance Use Topics  . Alcohol use: Yes    Alcohol/week: 2.4 oz    Types: 4 Cans of beer per week  . Drug use: No    Allergies  Allergen Reactions  . Codeine Itching    Current Outpatient Medications  Medication Sig Dispense Refill  . fluticasone (FLONASE) 50 MCG/ACT nasal spray fluticasone 50 mcg/actuation nasal spray,suspension    . hydrochlorothiazide (MICROZIDE) 12.5 MG capsule TAKE ONE CAPSULE BY MOUTH EVERY DAY FOR BLOOD PRESSURE  1  . venlafaxine XR (EFFEXOR-XR) 150 MG 24 hr capsule Take 150 mg by mouth daily.  1   No current facility-administered medications for this visit.     Review of Systems Review of Systems  Constitutional: Negative.   Respiratory: Negative.   Cardiovascular: Negative.     Blood pressure (!) 163/78, pulse 93, resp. rate 12, height 5\' 4"  (1.626 m), weight 151 lb (68.5  kg).  Physical Exam Physical Exam  Constitutional: She is oriented to person, place, and time. She appears well-developed and well-nourished.  Eyes: Conjunctivae are normal. No scleral icterus.  Neck: Neck supple.  Cardiovascular: Normal rate, regular rhythm and normal heart sounds.  Pulmonary/Chest: Effort normal and breath sounds normal.  Abdominal: Soft. Normal appearance and bowel sounds are normal. A hernia is present. Hernia confirmed positive in the ventral area.  Lymphadenopathy:    She has no cervical adenopathy.  Neurological: She is alert and oriented to person, place, and time.  Skin: Skin is warm and dry.    Data Reviewed    Assessment         Plan   Hernia precautions and incarceration were discussed with the patient. If they develop symptoms of an incarcerated hernia, they were encouraged to seek prompt medical attention. I have recommended repair of the hernia using mesh on an outpatient basis in the near future. The risk of infection was reviewed. The role of prosthetic mesh to minimize the risk of recurrence was reviewed.  HPI, Physical Exam, Assessment and Plan have been scribed under the direction and in the presence of Hervey Ard, MD.  Gaspar Cola, CMA  The patient is scheduled for surgery at [facility] on 06/10/17. The patient will pre admit by phone. She is aware of date and instructions.  Documented by Cleotis Lema  Kennedy LPN  Gaspar Cola 05/18/2017, 2:34 PM

## 2017-05-18 NOTE — Patient Instructions (Addendum)
Laparoscopic Ventral Hernia Repair Laparoscopic ventral hernia repairis a procedure to fix a bulge of tissue that pushes through a weak area of muscle in the abdomen (ventral hernia). A ventral hernia may be at the belly button (umbilical), above the belly button (epigastric), or at the incision site from previous abdominal surgery (incisional hernia). You may have this procedure as emergency surgery if part of your intestine gets trapped inside the hernia and starts to lose its blood supply (strangulation). Laparoscopic surgery is done through small incisions using a thin surgical telescope with a light and camera on the end (laparoscope). During surgery, your surgeon will use images from the laparoscope to guide the procedure. A mesh screen will be placed in the hernia to close the opening and strengthen the abdominal wall. Tell a health care provider about:  Any allergies you have.  All medicines you are taking, including vitamins, herbs, eye drops, creams, and over-the-counter medicines.  Any problems you or family members have had with anesthetic medicines.  Any blood disorders you have.  Any surgeries you have had.  Any medical conditions you have.  Whether you are pregnant or may be pregnant. What are the risks? Generally, this is a safe procedure. However, problems may occur, including:  Infection.  Bleeding.  Allergic reactions to medicines.  Damage to other structures or organs in the abdomen.  Trouble urinating or having a bowel movement after surgery.  Pneumonia.  Blood clots.  The hernia coming back after surgery.  Fluid buildup in the area of the hernia.  In some cases, your health care provider may need to switch from a laparoscopic procedure to a procedure that is done through a single, larger incision in the abdomen (open procedure). You may need an open procedure if:  You have a hernia that is difficult to repair.  Your organs are hard to see.  You have  bleeding problems during the laparoscopic procedure.  What happens before the procedure? Staying hydrated Follow instructions from your health care provider about hydration, which may include:  Up to 2 hours before the procedure - you may continue to drink clear liquids, such as water, clear fruit juice, black coffee, and plain tea.  Eating and drinking restrictions Follow instructions from your health care provider about eating and drinking, which may include:  8 hours before the procedure - stop eating heavy meals or foods such as meat, fried foods, or fatty foods.  6 hours before the procedure - stop eating light meals or foods, such as toast or cereal.  6 hours before the procedure - stop drinking milk or drinks that contain milk.  2 hours before the procedure - stop drinking clear liquids.  Medicines  Ask your health care provider about: ? Changing or stopping your regular medicines. This is especially important if you are taking diabetes medicines or blood thinners. ? Taking medicines such as aspirin and ibuprofen. These medicines can thin your blood. Do not take these medicines before your procedure if your health care provider instructs you not to.  You may be given antibiotic medicine to help prevent infection. General instructions  You may be asked to take a laxative or do an enema to empty your bowel before surgery (bowel prep).  Do not use any products that contain nicotine or tobacco, such as cigarettes and e-cigarettes. If you need help quitting, ask your health care provider.  You may need to have tests before the procedure, such as: ? Blood tests. ? Urine tests. ?  Abdominal ultrasound. ? Chest X-ray. ? Electrocardiogram (ECG).  Plan to have someone take you home from the hospital or clinic.  If you will be going home right after the procedure, plan to have someone with you for 24 hours. What happens during the procedure?  To reduce your risk of  infection: ? Your health care team will wash or sanitize their hands. ? Your skin will be washed with soap.  An IV tube will be inserted into one of your veins.  You will be given one or more of the following: ? A medicine to help you relax (sedative). ? A medicine to make you fall asleep (general anesthetic).  A small incision will be made in your abdomen. A hollow metal tube (trocar) will be placed through the incision.  A tube will be placed through the trocar to inflate your abdomen with air-like gas. This makes it easier for your surgeon to see inside your abdomen and do the repair.  The laparoscope will be inserted into your abdomen through the trocar. The laparoscope will send images to a monitor in the operating room.  Other trocars will be put through other small incisions in your abdomen. The surgical instruments needed for the procedure will be placed through these trocars.  The tissue or intestines that make up the hernia will be moved back into place.  The edges of the hernia may be stitched together.  A piece of mesh will be used to close the hernia. Stitches (sutures), clips, or staples will be used to keep the mesh in place.  A bandage (dressing) or skin glue will be put over the incisions. The procedure may vary among health care providers and hospitals. What happens after the procedure?  Your blood pressure, heart rate, breathing rate, and blood oxygen level will be monitored until the medicines you were given have worn off.  You will continue to receive fluids and medicines through an IV tube. Your IV tube will be removed when you can drink clear fluids.  You will be given pain medicine as needed.  You will be encouraged to get up and walk around as soon as possible.  You may have to wear compression stockings. These stockings help to prevent blood clots and reduce swelling in your legs.  You will be shown how to do deep breathing exercises to help prevent a  lung infection.  Do not drive for 24 hours if you were given a sedative. This information is not intended to replace advice given to you by your health care provider. Make sure you discuss any questions you have with your health care provider. Document Released: 02/09/2012 Document Revised: 10/10/2015 Document Reviewed: 10/10/2015 Elsevier Interactive Patient Education  [employer].   The patient is scheduled for surgery at [facility] on 06/10/17. The patient will pre admit by phone. She is aware of date and instructions.

## 2017-05-18 NOTE — Consult Note (Signed)
HPI Bonnie Washington is a 72 y.o. female here today for a evaluation of a abdominal wall hernia. Patient states she noticed this area about two years ago. In the the last year the area has got bigger. Pain with activity and went she bends over. Pops in and out.  Moves her bowels daily.  HPI  Past Medical History:  Diagnosis Date  . Diabetes mellitus without complication ([place])   . Hypertension     Past Surgical History:  Procedure Laterality Date  . CHOLECYSTECTOMY  2014  . FRACTURE SURGERY Right    elbow  . OPEN REDUCTION INTERNAL FIXATION (ORIF) DISTAL RADIAL FRACTURE Left 10/06/2015   Procedure: OPEN REDUCTION INTERNAL FIXATION (ORIF) DISTAL RADIAL FRACTURE;  Surgeon: Earnestine Leys, MD;  Location: [facility] ORS;  Service: Orthopedics;  Laterality: Left;    Family History  Problem Relation Age of Onset  . Hypertension Mother     Social History Social History   Tobacco Use  . Smoking status: Former Research scientist (life sciences)  . Smokeless tobacco: Never Used  Substance Use Topics  . Alcohol use: Yes    Alcohol/week: 2.4 oz    Types: 4 Cans of beer per week  . Drug use: No    Allergies  Allergen Reactions  . Codeine Itching    Current Outpatient Medications  Medication Sig Dispense Refill  . fluticasone (FLONASE) 50 MCG/ACT nasal spray fluticasone 50 mcg/actuation nasal spray,suspension    . hydrochlorothiazide (MICROZIDE) 12.5 MG capsule TAKE ONE CAPSULE BY MOUTH EVERY DAY FOR BLOOD PRESSURE  1  . venlafaxine XR (EFFEXOR-XR) 150 MG 24 hr capsule Take 150 mg by mouth daily.  1   No current facility-administered medications for this visit.     Review of Systems Review of Systems  Constitutional: Negative.   Respiratory: Negative.   Cardiovascular: Negative.     Blood pressure (!) 163/78, pulse 93, resp. rate 12, height 5\' 4"  (1.626 m), weight 151 lb (68.5 kg).  Physical Exam Physical Exam  Constitutional: She is oriented to person, place, and time. She appears well-developed and  well-nourished.  Eyes: Conjunctivae are normal. No scleral icterus.  Neck: Neck supple.  Cardiovascular: Normal rate, regular rhythm and normal heart sounds.  Pulmonary/Chest: Effort normal and breath sounds normal.  Abdominal: Soft. Normal appearance and bowel sounds are normal. A hernia is present. Hernia confirmed positive in the ventral area.     Lymphadenopathy:    She has no cervical adenopathy.  Neurological: She is alert and oriented to person, place, and time.  Skin: Skin is warm and dry.    Data Reviewed No labs to review.   Assessment    Ventral hernia at prior port site.    Plan   Hernia precautions and incarceration were discussed with the patient. If they develop symptoms of an incarcerated hernia, they were encouraged to seek prompt medical attention. I have recommended repair of the hernia using mesh on an outpatient basis in the near future. The risk of infection was reviewed. The role of prosthetic mesh to minimize the risk of recurrence was reviewed.  HPI, Physical Exam, Assessment and Plan have been scribed under the direction and in the presence of Bonnie Ard, MD.  Bonnie Washington, CMA  I have completed the exam and reviewed the above documentation for accuracy and completeness.  I agree with the above.  Haematologist has been used and any errors in dictation or transcription are unintentional.  Bonnie Washington, M.D., F.A.C.S.  The patient is scheduled for surgery  at [facility] on 06/10/17. The patient will pre admit by phone. She is aware of date and instructions.  Documented by Bonnie Washington  Bonnie Washington Bonnie Washington 05/18/2017, 3:32 PM

## 2017-05-20 ENCOUNTER — Other Ambulatory Visit: Payer: Self-pay | Admitting: Internal Medicine

## 2017-06-03 ENCOUNTER — Inpatient Hospital Stay: Admission: RE | Admit: 2017-06-03 | Payer: Self-pay | Source: Ambulatory Visit

## 2017-06-03 ENCOUNTER — Other Ambulatory Visit: Payer: Self-pay

## 2017-06-03 ENCOUNTER — Encounter
Admission: RE | Admit: 2017-06-03 | Discharge: 2017-06-03 | Disposition: A | Discharge status: Home / Self Care | Payer: PPO | Source: Ambulatory Visit | Attending: General Surgery | Admitting: General Surgery

## 2017-06-03 DIAGNOSIS — Z79899 Other long term (current) drug therapy: Secondary | ICD-10-CM | POA: Insufficient documentation

## 2017-06-03 DIAGNOSIS — E119 Type 2 diabetes mellitus without complications: Secondary | ICD-10-CM | POA: Diagnosis not present

## 2017-06-03 DIAGNOSIS — I10 Essential (primary) hypertension: Secondary | ICD-10-CM | POA: Insufficient documentation

## 2017-06-03 DIAGNOSIS — Z01812 Encounter for preprocedural laboratory examination: Secondary | ICD-10-CM | POA: Insufficient documentation

## 2017-06-03 DIAGNOSIS — Z87891 Personal history of nicotine dependence: Secondary | ICD-10-CM | POA: Diagnosis not present

## 2017-06-03 DIAGNOSIS — K439 Ventral hernia without obstruction or gangrene: Secondary | ICD-10-CM | POA: Diagnosis not present

## 2017-06-03 DIAGNOSIS — Z01810 Encounter for preprocedural cardiovascular examination: Secondary | ICD-10-CM | POA: Diagnosis not present

## 2017-06-03 HISTORY — DX: Depression, unspecified: F32.A

## 2017-06-03 HISTORY — DX: Major depressive disorder, single episode, unspecified: F32.9

## 2017-06-03 LAB — CBC WITH DIFFERENTIAL/PLATELET
BASOS ABS: 0 10*3/uL (ref 0–0.1)
BASOS PCT: 1 %
EOS ABS: 0 10*3/uL (ref 0–0.7)
EOS PCT: 1 %
HCT: 39.5 % (ref 35.0–47.0)
Hemoglobin: 13.4 g/dL (ref 12.0–16.0)
Lymphocytes Relative: 31 %
Lymphs Abs: 1.3 10*3/uL (ref 1.0–3.6)
MCH: 32.9 pg (ref 26.0–34.0)
MCHC: 33.8 g/dL (ref 32.0–36.0)
MCV: 97.3 fL (ref 80.0–100.0)
MONO ABS: 0.4 10*3/uL (ref 0.2–0.9)
Monocytes Relative: 9 %
Neutro Abs: 2.5 10*3/uL (ref 1.4–6.5)
Neutrophils Relative %: 58 %
PLATELETS: 264 10*3/uL (ref 150–440)
RBC: 4.06 MIL/uL (ref 3.80–5.20)
RDW: 12.8 % (ref 11.5–14.5)
WBC: 4.3 10*3/uL (ref 3.6–11.0)

## 2017-06-03 LAB — BASIC METABOLIC PANEL
ANION GAP: 10 (ref 5–15)
BUN: 15 mg/dL (ref 6–20)
CALCIUM: 9 mg/dL (ref 8.9–10.3)
CO2: 29 mmol/L (ref 22–32)
Chloride: 96 mmol/L — ABNORMAL LOW (ref 101–111)
Creatinine, Ser: 0.54 mg/dL (ref 0.44–1.00)
GFR calc Af Amer: >60 mL/min (ref >60)
GLUCOSE: 92 mg/dL (ref 65–99)
Potassium: 3.5 mmol/L (ref 3.5–5.1)
SODIUM: 135 mmol/L (ref 135–145)

## 2017-06-03 NOTE — Patient Instructions (Addendum)
Your procedure is scheduled [id number] , April 5TH  Report to [facility]  To find out your arrival time please call [phone] between 1PM - 3PM                 on Thursday, April 4TH    Remember: Instructions that are not followed completely may result in serious medical  risk, up to and including death, or upon the discretion of your surgeon and anesthesiologist  your surgery may need to be rescheduled.     _X__ 1. Do not eat food after midnight the night before your procedure.                 No gum chewing or hard candies.                  You may drink clear liquids up to 2 hours                 before you are scheduled to arrive for your surgery-                   DO not drink clear liquids within 2 hours of the start of your surgery.                 Clear Liquids include:  water, apple juice without pulp, clear carbohydrate                 drink such as Clearfast of Gatorade, Black Coffee or Tea (Do not add                 anything to coffee or tea).  __X__2.  On the morning of surgery brush your teeth with toothpaste and water,                    you may rinse your mouth with mouthwash if you wish.                        Do not swallow any toothpaste of mouthwash.     _X__ 3.  No Alcohol for 24 hours before or after surgery.   _X__ 4.  Do Not Smoke or use e-cigarettes For 24 Hours Prior to Your Surgery.                 Do not use any chewable tobacco products for at least 6 hours prior to                 surgery.  ____  5.  Bring all medications with you on the day of surgery if instructed.   _X___  6.  Notify your doctor if there is any change in your medical condition      (cold, fever, infections).     Do not wear jewelry, make-up, hairpins, clips or nail polish. Do not wear lotions, powders, or perfumes. You may wear deodorant. Do not shave 48 hours prior to surgery. Men may shave face and neck. Do not bring  valuables to the hospital.    [facility] is not responsible for any belongings or valuables.  Contacts, dentures or bridgework may not be worn into surgery. Leave your suitcase in the car. After surgery it may be brought to your room. For patients admitted to the hospital, discharge time is determined by your treatment team.   Patients discharged the day of surgery will not be  allowed to drive home.   Please read over the following fact sheets that you were given:   PREPARING FOR SURGERY   ____ Take these medicines the morning of surgery with A SIP OF WATER:    1. EFFEXOR  2.   3.   4.  5.  6.  ____ Fleet Enema (as directed)   __X__ Use CHG Soap as directed  ____ Use inhalers on the day of surgery  __X__ Stop ALL ASPIRIN PRODUCTS NOW!!  __X__ Stop Anti-inflammatories NOW!!             THIS INCLUDES IBUPROFEN / MOTRIN / ADVIL / ALEVE / NAPROSYN   ____ Stop supplements until after surgery.    ____ Bring C-Pap to the hospital.   CONTINUE TAKING HYDROCHLOROTHIAZIDE EVERY DAY BUT DO     NOT TAKE ON THE MORNING OF SURGERY  WEAR LOOSE FITTING AND COMFORTABLE CLOTHES ON THE DAY    OF SURGERY  YOU MAY TAKE TYLENOL IF NEEDED PRIOR TO SURGERY  HAVE STOOL SOFTENERS ON HAND FOR AFTER SURGERY

## 2017-06-09 MED ORDER — CEFAZOLIN SODIUM-DEXTROSE 2-4 GM/100ML-% IV SOLN
2.0000 g | INTRAVENOUS | Status: AC
  Administered 2017-06-10: 2 g via INTRAVENOUS

## 2017-06-10 ENCOUNTER — Encounter: Payer: Self-pay | Admitting: *Deleted

## 2017-06-10 ENCOUNTER — Ambulatory Visit: Payer: PPO | Admitting: Anesthesiology

## 2017-06-10 ENCOUNTER — Ambulatory Visit
Admission: RE | Admit: 2017-06-10 | Discharge: 2017-06-10 | Disposition: A | Discharge status: Home / Self Care | Payer: PPO | Source: Ambulatory Visit | Attending: General Surgery | Admitting: General Surgery

## 2017-06-10 ENCOUNTER — Other Ambulatory Visit: Payer: Self-pay

## 2017-06-10 ENCOUNTER — Encounter
Admission: RE | Disposition: A | Discharge status: Home / Self Care | Payer: Self-pay | Source: Ambulatory Visit | Attending: General Surgery

## 2017-06-10 DIAGNOSIS — E119 Type 2 diabetes mellitus without complications: Secondary | ICD-10-CM | POA: Diagnosis not present

## 2017-06-10 DIAGNOSIS — Z79899 Other long term (current) drug therapy: Secondary | ICD-10-CM | POA: Diagnosis not present

## 2017-06-10 DIAGNOSIS — F329 Major depressive disorder, single episode, unspecified: Secondary | ICD-10-CM | POA: Diagnosis not present

## 2017-06-10 DIAGNOSIS — I10 Essential (primary) hypertension: Secondary | ICD-10-CM | POA: Diagnosis not present

## 2017-06-10 DIAGNOSIS — K439 Ventral hernia without obstruction or gangrene: Secondary | ICD-10-CM | POA: Insufficient documentation

## 2017-06-10 DIAGNOSIS — J449 Chronic obstructive pulmonary disease, unspecified: Secondary | ICD-10-CM | POA: Diagnosis not present

## 2017-06-10 DIAGNOSIS — G4730 Sleep apnea, unspecified: Secondary | ICD-10-CM | POA: Diagnosis not present

## 2017-06-10 DIAGNOSIS — Z87891 Personal history of nicotine dependence: Secondary | ICD-10-CM | POA: Insufficient documentation

## 2017-06-10 HISTORY — PX: VENTRAL HERNIA REPAIR: SHX424

## 2017-06-10 LAB — GLUCOSE, CAPILLARY
GLUCOSE-CAPILLARY: 154 mg/dL — AB (ref 65–99)
Glucose-Capillary: 160 mg/dL — ABNORMAL HIGH (ref 65–99)

## 2017-06-10 SURGERY — REPAIR, HERNIA, VENTRAL
Anesthesia: General | Wound class: "Clean "

## 2017-06-10 MED ORDER — BUPIVACAINE-EPINEPHRINE (PF) 0.5% -1:200000 IJ SOLN
INTRAMUSCULAR | Status: DC | PRN
  Administered 2017-06-10: 21 mL via PERINEURAL

## 2017-06-10 MED ORDER — PROMETHAZINE HCL 25 MG/ML IJ SOLN: 6.2500 mg | INTRAMUSCULAR | Status: DC | PRN

## 2017-06-10 MED ORDER — FAMOTIDINE 20 MG PO TABS
20.0000 mg | ORAL_TABLET | Freq: Once | ORAL | Status: AC
  Administered 2017-06-10: 20 mg via ORAL

## 2017-06-10 MED ORDER — SCOPOLAMINE 1 MG/3DAYS TD PT72
MEDICATED_PATCH | TRANSDERMAL | Status: AC
  Filled 2017-06-10: qty 1

## 2017-06-10 MED ORDER — PROPOFOL 10 MG/ML IV BOLUS
INTRAVENOUS | Status: AC
  Filled 2017-06-10: qty 20

## 2017-06-10 MED ORDER — ONDANSETRON HCL 4 MG/2ML IJ SOLN
INTRAMUSCULAR | Status: DC | PRN
  Administered 2017-06-10: 4 mg via INTRAVENOUS

## 2017-06-10 MED ORDER — FAMOTIDINE 20 MG PO TABS
ORAL_TABLET | ORAL | Status: AC
  Administered 2017-06-10: 20 mg via ORAL
  Filled 2017-06-10: qty 1

## 2017-06-10 MED ORDER — MEPERIDINE HCL 50 MG/ML IJ SOLN: 6.2500 mg | INTRAMUSCULAR | Status: DC | PRN

## 2017-06-10 MED ORDER — CELECOXIB 200 MG PO CAPS
200.0000 mg | ORAL_CAPSULE | ORAL | Status: AC
  Administered 2017-06-10: 200 mg via ORAL

## 2017-06-10 MED ORDER — ACETAMINOPHEN 10 MG/ML IV SOLN
INTRAVENOUS | Status: DC | PRN
  Administered 2017-06-10: 1000 mg via INTRAVENOUS

## 2017-06-10 MED ORDER — PROPOFOL 10 MG/ML IV BOLUS
INTRAVENOUS | Status: DC | PRN
  Administered 2017-06-10: 100 mg via INTRAVENOUS

## 2017-06-10 MED ORDER — GABAPENTIN 300 MG PO CAPS
300.0000 mg | ORAL_CAPSULE | ORAL | Status: AC
  Administered 2017-06-10: 300 mg via ORAL

## 2017-06-10 MED ORDER — ONDANSETRON HCL 4 MG/2ML IJ SOLN
INTRAMUSCULAR | Status: AC
  Filled 2017-06-10: qty 2

## 2017-06-10 MED ORDER — SODIUM CHLORIDE 0.9 % IV SOLN
INTRAVENOUS | Status: DC
  Administered 2017-06-10: 07:00:00 via INTRAVENOUS

## 2017-06-10 MED ORDER — OXYCODONE HCL 5 MG/5ML PO SOLN: 5.0000 mg | Freq: Once | ORAL | Status: AC | PRN

## 2017-06-10 MED ORDER — ACETAMINOPHEN 10 MG/ML IV SOLN
INTRAVENOUS | Status: AC
  Filled 2017-06-10: qty 100

## 2017-06-10 MED ORDER — LACTATED RINGERS IV SOLN
INTRAVENOUS | Status: DC | PRN
  Administered 2017-06-10: 07:00:00 via INTRAVENOUS

## 2017-06-10 MED ORDER — DEXAMETHASONE SODIUM PHOSPHATE 10 MG/ML IJ SOLN
INTRAMUSCULAR | Status: DC | PRN
  Administered 2017-06-10: 5 mg via INTRAVENOUS

## 2017-06-10 MED ORDER — DEXAMETHASONE SODIUM PHOSPHATE 10 MG/ML IJ SOLN
INTRAMUSCULAR | Status: AC
  Filled 2017-06-10: qty 1

## 2017-06-10 MED ORDER — CEFAZOLIN SODIUM-DEXTROSE 2-4 GM/100ML-% IV SOLN
INTRAVENOUS | Status: AC
  Filled 2017-06-10: qty 100

## 2017-06-10 MED ORDER — MIDAZOLAM HCL 2 MG/2ML IJ SOLN
INTRAMUSCULAR | Status: DC | PRN
  Administered 2017-06-10: 2 mg via INTRAVENOUS

## 2017-06-10 MED ORDER — MIDAZOLAM HCL 2 MG/2ML IJ SOLN
INTRAMUSCULAR | Status: AC
Start: 2017-06-10 — End: ?
  Filled 2017-06-10: qty 2

## 2017-06-10 MED ORDER — GABAPENTIN 300 MG PO CAPS
ORAL_CAPSULE | ORAL | Status: AC
  Administered 2017-06-10: 300 mg via ORAL
  Filled 2017-06-10: qty 1

## 2017-06-10 MED ORDER — LIDOCAINE HCL (CARDIAC) 20 MG/ML IV SOLN
INTRAVENOUS | Status: DC | PRN
  Administered 2017-06-10: 80 mg via INTRAVENOUS

## 2017-06-10 MED ORDER — BUPIVACAINE-EPINEPHRINE (PF) 0.5% -1:200000 IJ SOLN
INTRAMUSCULAR | Status: AC
  Filled 2017-06-10: qty 30

## 2017-06-10 MED ORDER — SEVOFLURANE IN SOLN
RESPIRATORY_TRACT | Status: AC
  Filled 2017-06-10: qty 250

## 2017-06-10 MED ORDER — FENTANYL CITRATE (PF) 250 MCG/5ML IJ SOLN
INTRAMUSCULAR | Status: AC
  Filled 2017-06-10: qty 5

## 2017-06-10 MED ORDER — OXYCODONE HCL 5 MG PO TABS
5.0000 mg | ORAL_TABLET | Freq: Once | ORAL | Status: AC | PRN
  Administered 2017-06-10: 5 mg via ORAL

## 2017-06-10 MED ORDER — SCOPOLAMINE 1 MG/3DAYS TD PT72
1.0000 | MEDICATED_PATCH | TRANSDERMAL | Status: DC
  Administered 2017-06-10: 1.5 mg via TRANSDERMAL

## 2017-06-10 MED ORDER — CELECOXIB 200 MG PO CAPS
ORAL_CAPSULE | ORAL | Status: AC
  Administered 2017-06-10: 200 mg via ORAL
  Filled 2017-06-10: qty 1

## 2017-06-10 MED ORDER — FENTANYL CITRATE (PF) 100 MCG/2ML IJ SOLN: 25.0000 ug | INTRAMUSCULAR | Status: DC | PRN

## 2017-06-10 MED ORDER — TRAMADOL HCL 50 MG PO TABS: 50.0000 mg | ORAL_TABLET | ORAL | 0 refills | Status: DC | PRN

## 2017-06-10 MED ORDER — OXYCODONE HCL 5 MG PO TABS
ORAL_TABLET | ORAL | Status: AC
  Filled 2017-06-10: qty 1

## 2017-06-10 SURGICAL SUPPLY — 35 items
BLADE SURG 15 STRL SS SAFETY (BLADE) ×2 IMPLANT
CANISTER SUCT 1200ML W/VALVE (MISCELLANEOUS) ×2 IMPLANT
CHLORAPREP W/TINT 26ML (MISCELLANEOUS) ×2 IMPLANT
DRAIN CHANNEL JP 15F RND 16 (MISCELLANEOUS) ×4 IMPLANT
DRAPE CHEST BREAST 77X106 FENE (MISCELLANEOUS) ×2 IMPLANT
DRAPE LAPAROTOMY 100X77 ABD (DRAPES) ×2 IMPLANT
DRSG TEGADERM 4X4.75 (GAUZE/BANDAGES/DRESSINGS) ×4 IMPLANT
DRSG TELFA 3X8 NADH (GAUZE/BANDAGES/DRESSINGS) ×2 IMPLANT
ELECT REM PT RETURN 9FT ADLT (ELECTROSURGICAL) ×2
ELECTRODE REM PT RTRN 9FT ADLT (ELECTROSURGICAL) ×1 IMPLANT
GAUZE SPONGE 4X4 12PLY STRL (GAUZE/BANDAGES/DRESSINGS) ×2 IMPLANT
GLOVE BIO SURGEON STRL SZ7.5 (GLOVE) ×2 IMPLANT
GLOVE INDICATOR 8.0 STRL GRN (GLOVE) ×2 IMPLANT
GOWN STRL REUS W/ TWL LRG LVL3 (GOWN DISPOSABLE) ×2 IMPLANT
GOWN STRL REUS W/TWL LRG LVL3 (GOWN DISPOSABLE) ×2
KIT TURNOVER KIT A (KITS) ×2 IMPLANT
LABEL OR SOLS (LABEL) ×2 IMPLANT
NDL HYPO 25X1 1.5 SAFETY (NEEDLE) ×1 IMPLANT
NEEDLE HYPO 22GX1.5 SAFETY (NEEDLE) ×2 IMPLANT
NEEDLE HYPO 25X1 1.5 SAFETY (NEEDLE) ×2 IMPLANT
NS IRRIG 500ML POUR BTL (IV SOLUTION) ×2 IMPLANT
PACK BASIN MINOR ARMC (MISCELLANEOUS) ×2 IMPLANT
PAD DRESSING TELFA 3X8 NADH (GAUZE/BANDAGES/DRESSINGS) ×1 IMPLANT
SPONGE LAP 18X18 5 PK (GAUZE/BANDAGES/DRESSINGS) ×2 IMPLANT
STAPLER SKIN PROX 35W (STAPLE) ×2 IMPLANT
STRIP CLOSURE SKIN 1/2X4 (GAUZE/BANDAGES/DRESSINGS) ×2 IMPLANT
SUT SURGILON 0 BLK (SUTURE) ×4 IMPLANT
SUT VIC AB 2-0 BRD 54 (SUTURE) ×2 IMPLANT
SUT VIC AB 3-0 SH 27 (SUTURE) ×1
SUT VIC AB 3-0 SH 27X BRD (SUTURE) ×1 IMPLANT
SUT VIC AB 4-0 FS2 27 (SUTURE) ×2 IMPLANT
SUT VICRYL+ 3-0 144IN (SUTURE) ×2 IMPLANT
SYR 10ML LL (SYRINGE) ×2 IMPLANT
SYR 3ML LL SCALE MARK (SYRINGE) ×2 IMPLANT
TRAY FOLEY W/METER SILVER 16FR (SET/KITS/TRAYS/PACK) ×2 IMPLANT

## 2017-06-10 NOTE — Op Note (Signed)
Preoperative diagnosis: Epigastric port site hernia.  Postoperative diagnosis: Same.  Operative procedure: Repair of epigastric port site hernia, primary.  Operative Surgeon: Hervey Ard, MD.  Anesthesia: General by LMA, Marcaine 0.5% with 1 200,000 units of epinephrine, 21 cc.  Estimated blood loss: Less than 2 cc.  Clinical note: This 72 year old woman had previously undergone laparoscopic cholecystectomy and developed an epigastric port site hernia about 2 years ago.  This become increasingly symptomatic.  She was admitted for elective repair.  Operative note: In the holding area the site of the hernia was marked just below the prior epigastric port site incision.  The patient was taken to the operating room and she underwent general anesthesia without difficulty.  The abdomen was cleansed with ChloraPrep and draped.  Local anesthesia was infiltrated for postoperative comfort.  The original incision was extended by about 100%.  The skin was incised sharply and remaining dissection completed with electrocautery.  The preperitoneal fat was found to be protruding through 12 mm fascial defect.  The fascia was cleared on both the superior and inferior surfaces.  The defect was closed with interrupted 0 Surgilon sutures.  These were all placed sequentially and then tied.  The adipose layer was closed with a running 3-0 Vicryl suture.  The skin was closed with a running 4-0 Vicryl subcuticular suture.  Benzoin, Steri-Strips, Telfa and Tegaderm dressing applied.  The patient tolerated the procedure well was taken recovery in stable condition.

## 2017-06-10 NOTE — Transfer of Care (Signed)
Immediate Anesthesia Transfer of Care Note  Patient: Bonnie Washington  Procedure(s) Performed: HERNIA REPAIR VENTRAL ADULT (N/A )  Patient Location: PACU  Anesthesia Type:General  Level of Consciousness: sedated  Airway & Oxygen Therapy: Patient Spontanous Breathing and Patient connected to face mask oxygen  Post-op Assessment: Report given to RN and Post -op Vital signs reviewed and stable  Post vital signs: Reviewed and stable  Last Vitals:  Vitals Value Taken Time  BP 124/57 06/10/2017  8:03 AM  Temp 36.8 C 06/10/2017  8:03 AM  Pulse 67 06/10/2017  8:09 AM  Resp 13 06/10/2017  8:09 AM  SpO2 100 % 06/10/2017  8:09 AM  Vitals shown include unvalidated device data.  Last Pain:  Vitals:   06/10/17 0803  TempSrc:   PainSc: Asleep         Complications: No apparent anesthesia complications

## 2017-06-10 NOTE — Anesthesia Procedure Notes (Signed)
Procedure Name: LMA Insertion Date/Time: 06/10/2017 7:32 AM Performed by: Justus Memory, CRNA Pre-anesthesia Checklist: Patient identified, Patient being monitored, Timeout performed, Emergency Drugs available and Suction available Patient Re-evaluated:Patient Re-evaluated prior to induction Oxygen Delivery Method: Circle system utilized Preoxygenation: Pre-oxygenation with 100% oxygen Induction Type: IV induction Ventilation: Mask ventilation without difficulty LMA: LMA inserted LMA Size: 3.5 Tube type: Oral Number of attempts: 1 Placement Confirmation: positive ETCO2 and breath sounds checked- equal and bilateral Tube secured with: Tape Dental Injury: Teeth and Oropharynx as per pre-operative assessment

## 2017-06-10 NOTE — Anesthesia Post-op Follow-up Note (Signed)
Anesthesia QCDR form completed.        

## 2017-06-10 NOTE — Anesthesia Postprocedure Evaluation (Signed)
Anesthesia Post Note  Patient: Bonnie Washington  Procedure(s) Performed: HERNIA REPAIR VENTRAL ADULT (N/A )  Patient location during evaluation: PACU Anesthesia Type: General Level of consciousness: awake and alert and oriented Pain management: pain level controlled Vital Signs Assessment: post-procedure vital signs reviewed and stable Respiratory status: spontaneous breathing, nonlabored ventilation and respiratory function stable Cardiovascular status: blood pressure returned to baseline and stable Postop Assessment: no signs of nausea or vomiting Anesthetic complications: no     Last Vitals:  Vitals:   06/10/17 0849 06/10/17 0915  BP: (!) 145/71 139/61  Pulse: 75 73  Resp: 16   Temp: (!) 35.9 C   SpO2: 95% 95%    Last Pain:  Vitals:   06/10/17 0939  TempSrc:   PainSc: 4                  Mannat Benedetti

## 2017-06-10 NOTE — H&P (Signed)
No change in clinical history or exam.  

## 2017-06-10 NOTE — Anesthesia Preprocedure Evaluation (Signed)
Anesthesia Evaluation  Patient identified by MRN, date of birth, ID band Patient awake    Reviewed: Allergy & Precautions, NPO status , Patient's Chart, lab work & pertinent test results  History of Anesthesia Complications (+) PONV and history of anesthetic complications  Airway Mallampati: II  TM Distance: >3 FB Neck ROM: Full    Dental no notable dental hx.    Pulmonary neg sleep apnea, neg COPD, former smoker,    breath sounds clear to auscultation- rhonchi (-) wheezing      Cardiovascular Exercise Tolerance: Good hypertension, Pt. on medications (-) CAD, (-) Past MI, (-) Cardiac Stents and (-) CABG  Rhythm:Regular Rate:Normal - Systolic murmurs and - Diastolic murmurs    Neuro/Psych PSYCHIATRIC DISORDERS Depression negative neurological ROS     GI/Hepatic negative GI ROS, Neg liver ROS,   Endo/Other  diabetes (diet controlled diabetes)  Renal/GU negative Renal ROS     Musculoskeletal negative musculoskeletal ROS (+)   Abdominal (+) - obese,   Peds  Hematology negative hematology ROS (+)   Anesthesia Other Findings Past Medical History: No date: Depression No date: Diabetes mellitus without complication ([facility]) No date: Hypertension   Reproductive/Obstetrics                             Anesthesia Physical Anesthesia Plan  ASA: II  Anesthesia Plan: General   Post-op Pain Management:    Induction: Intravenous  PONV Risk Score and Plan: 2 and Ondansetron, Dexamethasone, Midazolam and Scopolamine patch - Pre-op  Airway Management Planned: LMA  Additional Equipment:   Intra-op Plan:   Post-operative Plan:   Informed Consent: I have reviewed the patients History and Physical, chart, labs and discussed the procedure including the risks, benefits and alternatives for the proposed anesthesia with the patient or authorized representative who has indicated his/her understanding  and acceptance.   Dental advisory given  Plan Discussed with: CRNA and Anesthesiologist  Anesthesia Plan Comments:         Anesthesia Quick Evaluation

## 2017-06-10 NOTE — Progress Notes (Signed)
Dr. Bary Castilla states pt may go home

## 2017-06-23 ENCOUNTER — Ambulatory Visit (INDEPENDENT_AMBULATORY_CARE_PROVIDER_SITE_OTHER): Payer: PPO | Admitting: General Surgery

## 2017-06-23 ENCOUNTER — Encounter: Payer: Self-pay | Admitting: General Surgery

## 2017-06-23 VITALS — BP 142/74 | HR 82 | Resp 14 | Ht 64.0 in | Wt 146.0 lb

## 2017-06-23 DIAGNOSIS — K439 Ventral hernia without obstruction or gangrene: Secondary | ICD-10-CM

## 2017-06-23 NOTE — Progress Notes (Signed)
Patient ID: Bonnie Washington, female   DOB: [birth year], 72 y.o.   MRN: [id number]  Chief Complaint  Patient presents with  . Routine Post Op    HPI Bonnie Washington is a 72 y.o. female here today for her post op ventral hernia repair done on 06/10/2017. She is recovering from a stomach bug that she had Monday and Tuesday. HPI  Past Medical History:  Diagnosis Date  . Depression   . Diabetes mellitus without complication ([place])   . Hypertension     Past Surgical History:  Procedure Laterality Date  . CHOLECYSTECTOMY  2014  . FRACTURE SURGERY Right 2004   elbow  . OPEN REDUCTION INTERNAL FIXATION (ORIF) DISTAL RADIAL FRACTURE Left 10/06/2015   Procedure: OPEN REDUCTION INTERNAL FIXATION (ORIF) DISTAL RADIAL FRACTURE;  Surgeon: Earnestine Leys, MD;  Location: [facility] ORS;  Service: Orthopedics;  Laterality: Left;  Marland Kitchen VENTRAL HERNIA REPAIR N/A 06/10/2017   Procedure: HERNIA REPAIR VENTRAL ADULT;  Surgeon: Robert Bellow, MD;  Location: [facility] ORS;  Service: General;  Laterality: N/A;    Family History  Problem Relation Age of Onset  . Hypertension Mother     Social History Social History   Tobacco Use  . Smoking status: Former Smoker    Packs/day: 1.00    Types: Cigarettes    Last attempt to quit: 03/08/1993    Years since quitting: 24.3  . Smokeless tobacco: Never Used  Substance Use Topics  . Alcohol use: Yes    Alcohol/week: 2.4 oz    Types: 4 Cans of beer per week  . Drug use: No    Allergies  Allergen Reactions  . Codeine Itching    Okay if takes benadryl along with it    Current Outpatient Medications  Medication Sig Dispense Refill  . aspirin EC 81 MG tablet Take 81 mg by mouth daily.    . fluticasone (FLONASE) 50 MCG/ACT nasal spray Place 1 spray into both nostrils daily as needed for allergies or rhinitis.    . hydrochlorothiazide (MICROZIDE) 12.5 MG capsule TAKE 12.5 MG BY MOUTH EVERY DAY FOR BLOOD PRESSURE  1  . venlafaxine XR (EFFEXOR-XR) 150 MG 24 hr capsule TAKE ONE  CAPSULE BY MOUTH DAILY (Patient taking differently: TAKE 150 MG BY MOUTH DAILY) 90 capsule 1   No current facility-administered medications for this visit.     Review of Systems Review of Systems  Constitutional: Negative.   Respiratory: Negative.   Cardiovascular: Negative.     Blood pressure (!) 142/74, pulse 82, resp. rate 14, height 5\' 4"  (1.626 m), weight 146 lb (66.2 kg), SpO2 97 %.  Physical Exam Physical Exam  Constitutional: She is oriented to person, place, and time. She appears well-developed and well-nourished.  Abdominal: Soft. Normal appearance. There is no tenderness.    Abdominal incision healing well.  Neurological: She is alert and oriented to person, place, and time.  Skin: Skin is warm and dry.  Psychiatric: Her behavior is normal.       Assessment    Doing well status post primary repair of an epigastric port site hernia.    Plan    Follow up as needed. Proper lifting techniques reviewed. Resume activities as tolerated.      HPI, Physical Exam, Assessment and Plan have been scribed under the direction and in the presence of Robert Bellow, MD. Karie Fetch, RN  I have completed the exam and reviewed the above documentation for accuracy and completeness.  I agree with the  above.  Haematologist has been used and any errors in dictation or transcription are unintentional.  Hervey Ard, M.D., F.A.C.S.  Bonnie Washington 06/23/2017, 10:55 AM

## 2017-06-23 NOTE — Patient Instructions (Addendum)
The patient is aware to call back for any questions or new concerns. Proper lifting techniques reviewed. Resume activities as tolerated.  

## 2017-09-14 ENCOUNTER — Telehealth: Payer: Self-pay

## 2017-09-14 NOTE — Telephone Encounter (Signed)
PT COLOGUARD ORDER WAS CANCELED BECAUSE IT HAS EXCEEDED 365 DAYS FROM THE INITIAL ORDER.   COLOGUARD ORDER: [id number]

## 2017-09-28 ENCOUNTER — Other Ambulatory Visit: Payer: Self-pay | Admitting: Internal Medicine

## 2017-10-13 ENCOUNTER — Encounter: Payer: Self-pay | Admitting: Nurse Practitioner

## 2017-10-13 ENCOUNTER — Ambulatory Visit (INDEPENDENT_AMBULATORY_CARE_PROVIDER_SITE_OTHER): Payer: PPO | Admitting: Nurse Practitioner

## 2017-10-13 VITALS — BP 142/70 | HR 61 | Resp 16 | Ht 64.0 in | Wt 148.0 lb

## 2017-10-13 DIAGNOSIS — R300 Dysuria: Secondary | ICD-10-CM

## 2017-10-13 DIAGNOSIS — E559 Vitamin D deficiency, unspecified: Secondary | ICD-10-CM

## 2017-10-13 DIAGNOSIS — Z1239 Encounter for other screening for malignant neoplasm of breast: Secondary | ICD-10-CM

## 2017-10-13 DIAGNOSIS — Z1231 Encounter for screening mammogram for malignant neoplasm of breast: Secondary | ICD-10-CM

## 2017-10-13 DIAGNOSIS — I10 Essential (primary) hypertension: Secondary | ICD-10-CM | POA: Diagnosis not present

## 2017-10-13 DIAGNOSIS — Z0001 Encounter for general adult medical examination with abnormal findings: Secondary | ICD-10-CM

## 2017-10-13 DIAGNOSIS — R7301 Impaired fasting glucose: Secondary | ICD-10-CM | POA: Diagnosis not present

## 2017-10-13 NOTE — Progress Notes (Signed)
[facility] [place], [place] [address]  Internal MEDICINE  Office Visit Note  Patient Name: Bonnie Washington  [id number]  [id number]  Date of Service: 10/24/2017   Pt is here for routine health maintenance examination  Chief Complaint  Patient presents with  . Annual Exam    6 month cpe  . Hypertension     Hypertension  This is a chronic problem. The current episode started more than 1 year ago. The problem is unchanged. The problem is controlled. Pertinent negatives include no chest pain, neck pain, palpitations or shortness of breath. There are no associated agents to hypertension. Risk factors for coronary artery disease include post-menopausal state. Past treatments include diuretics. The current treatment provides moderate improvement. There are no compliance problems.      Current Medication: Outpatient Encounter Medications as of 10/13/2017  Medication Sig  . aspirin EC 81 MG tablet Take 81 mg by mouth daily.  . fluticasone (FLONASE) 50 MCG/ACT nasal spray Place 1 spray into both nostrils daily as needed for allergies or rhinitis.  . hydrochlorothiazide (MICROZIDE) 12.5 MG capsule TAKE ONE CAPSULE BY MOUTH EVERY DAY FOR BLOOD PRESSURE  . venlafaxine XR (EFFEXOR-XR) 150 MG 24 hr capsule TAKE ONE CAPSULE BY MOUTH DAILY (Patient taking differently: TAKE 150 MG BY MOUTH DAILY)   No facility-administered encounter medications on file as of 10/13/2017.     Surgical History: Past Surgical History:  Procedure Laterality Date  . CHOLECYSTECTOMY  2014  . FRACTURE SURGERY Right 2004   elbow  . OPEN REDUCTION INTERNAL FIXATION (ORIF) DISTAL RADIAL FRACTURE Left 10/06/2015   Procedure: OPEN REDUCTION INTERNAL FIXATION (ORIF) DISTAL RADIAL FRACTURE;  Surgeon: Earnestine Leys, MD;  Location: [facility] ORS;  Service: Orthopedics;  Laterality: Left;  Marland Kitchen VENTRAL HERNIA REPAIR N/A 06/10/2017   Procedure: HERNIA REPAIR VENTRAL ADULT;  Surgeon: Robert Bellow, MD;   Location: [facility] ORS;  Service: General;  Laterality: N/A;    Medical History: Past Medical History:  Diagnosis Date  . Depression   . Diabetes mellitus without complication ([place])    pt use to take metformin but has not taken it in 20yrs  . Hypertension     Family History: Family History  Problem Relation Age of Onset  . Hypertension Mother       Review of Systems  Constitutional: Negative for activity change, chills, fatigue and unexpected weight change.  HENT: Negative for congestion, postnasal drip, rhinorrhea, sneezing and sore throat.   Eyes: Negative.  Negative for redness.  Respiratory: Negative for cough, chest tightness, shortness of breath and wheezing.   Cardiovascular: Negative for chest pain and palpitations.  Gastrointestinal: Negative for abdominal pain, constipation, diarrhea, nausea and vomiting.  Endocrine: Negative for cold intolerance, heat intolerance, polydipsia, polyphagia and polyuria.  Genitourinary: Negative for dysuria, flank pain and frequency.  Musculoskeletal: Negative for arthralgias, back pain, joint swelling and neck pain.  Skin: Negative for color change, pallor, rash and wound.  Allergic/Immunologic: Negative for environmental allergies, food allergies and immunocompromised state.  Neurological: Negative for dizziness, tremors, facial asymmetry and numbness.  Hematological: Negative for adenopathy. Does not bruise/bleed easily.  Psychiatric/Behavioral: Positive for dysphoric mood. Negative for behavioral problems (Depression), sleep disturbance and suicidal ideas. The patient is nervous/anxious.      Today's Vitals   10/13/17 1137  BP: (!) 142/70  Pulse: 61  Resp: 16  SpO2: 97%  Weight: 148 lb (67.1 kg)  Height: 5\' 4"  (1.626 m)   Physical Exam  Constitutional: She  is oriented to person, place, and time. She appears well-developed and well-nourished. No distress.  HENT:  Head: Normocephalic and atraumatic.  Nose: Nose normal.   Mouth/Throat: Oropharynx is clear and moist. No oropharyngeal exudate.  Eyes: Pupils are equal, round, and reactive to light. Conjunctivae and EOM are normal.  Neck: Normal range of motion. Neck supple. No JVD present. Carotid bruit is not present. No tracheal deviation present. No thyromegaly present.  Cardiovascular: Normal rate, regular rhythm, normal heart sounds and intact distal pulses. Exam reveals no gallop and no friction rub.  No murmur heard. Pulmonary/Chest: Effort normal and breath sounds normal. No respiratory distress. She has no wheezes. She has no rales. She exhibits no tenderness. Right breast exhibits no inverted nipple, no mass, no nipple discharge, no skin change and no tenderness. Left breast exhibits no inverted nipple, no mass, no nipple discharge, no skin change and no tenderness.  Abdominal: Soft. Bowel sounds are normal. There is no tenderness.  Musculoskeletal: Normal range of motion.  Lymphadenopathy:    She has no cervical adenopathy.  Neurological: She is alert and oriented to person, place, and time. No cranial nerve deficit.  Skin: Skin is warm and dry. Capillary refill takes less than 2 seconds. She is not diaphoretic.  Psychiatric: She has a normal mood and affect. Her behavior is normal. Judgment and thought content normal.  Nursing note and vitals reviewed.    LABS: Recent Results (from the past 2160 hour(s))  UA/M w/rflx Culture, Routine     Status: Abnormal   Collection Time: 10/13/17 12:00 AM  Result Value Ref Range   Specific Gravity, UA 1.014 1.005 - 1.030   pH, UA 6.0 5.0 - 7.5   Color, UA Yellow Yellow   Appearance Ur Clear Clear   Leukocytes, UA 2+ (A) Negative   Protein, UA Negative Negative/Trace   Glucose, UA Negative Negative   Ketones, UA Negative Negative   RBC, UA Negative Negative   Bilirubin, UA Negative Negative   Urobilinogen, Ur 0.2 0.2 - 1.0 mg/dL   Nitrite, UA Negative Negative   Microscopic Examination See below:      Comment: Microscopic was indicated and was performed.   Urinalysis Reflex Comment     Comment: This specimen has reflexed to a Urine Culture.  Microscopic Examination     Status: None   Collection Time: 10/13/17 12:00 AM  Result Value Ref Range   WBC, UA 0-5 0 - 5 /hpf   RBC, UA 0-2 0 - 2 /hpf   Epithelial Cells (non renal) 0-10 0 - 10 /hpf   Casts None seen None seen /lpf   Mucus, UA Present Not Estab.   Bacteria, UA Few None seen/Few  Urine Culture, Reflex     Status: None   Collection Time: 10/13/17 12:00 AM  Result Value Ref Range   Urine Culture, Routine Final report    Organism ID, Bacteria Comment     Comment: Mixed urogenital flora 25,000-50,000 colony forming units per mL    Assessment/Plan: 1. Encounter for general adult medical examination with abnormal findings Annual health maintenance exam today. - CBC with Differential/Platelet - Comprehensive metabolic panel - T4, free - TSH  2. Essential hypertension Generally stable. Continue bp medication as prescribed - Lipid panel  3. Impaired fasting glucose Check labs with HgbA1c for further evaluation.  - HgB A1c  4. Vitamin D deficiency - Vitamin D 1,25 dihydroxy  5. Screening for breast cancer - MM 3D SCREEN BREAST BILATERAL; Future  General  Counseling: Bonnie Washington verbalizes understanding of the findings of todays visit and agrees with plan of treatment. I have discussed any further diagnostic evaluation that may be needed or ordered today. We also reviewed her medications today. she has been encouraged to call the office with any questions or concerns that should arise related to todays visit.    Counseling: Hypertension Counseling:   The following hypertensive lifestyle modification were recommended and discussed:  1. Limiting alcohol intake to less than 1 oz/day of ethanol:(24 oz of beer or 8 oz of wine or 2 oz of 100-proof whiskey). 2. Take baby ASA 81 mg daily. 3. Importance of regular aerobic exercise  and losing weight. 4. Reduce dietary saturated fat and cholesterol intake for overall cardiovascular health. 5. Maintaining adequate dietary potassium, calcium, and magnesium intake. 6. Regular monitoring of the blood pressure. 7. Reduce sodium intake to less than 100 mmol/day (less than 2.3 gm of sodium or less than 6 gm of sodium choride)   This patient was seen by Eleanor with Dr Lavera Guise as a part of collaborative care agreement   Orders Placed This Encounter  Procedures  . Microscopic Examination  . Urine Culture, Reflex  . MM 3D SCREEN BREAST BILATERAL  . CBC with Differential/Platelet  . Comprehensive metabolic panel  . T4, free  . TSH  . Lipid panel  . Vitamin D 1,25 dihydroxy  . HgB A1c  . UA/M w/rflx Culture, Routine    Time spent: [place], MD  Internal Medicine

## 2017-10-16 LAB — URINE CULTURE, REFLEX

## 2017-10-16 LAB — MICROSCOPIC EXAMINATION: Casts: NONE SEEN /lpf

## 2017-10-16 LAB — UA/M W/RFLX CULTURE, ROUTINE
BILIRUBIN UA: NEGATIVE
GLUCOSE, UA: NEGATIVE
KETONES UA: NEGATIVE
Nitrite, UA: NEGATIVE
Protein, UA: NEGATIVE
RBC UA: NEGATIVE
SPEC GRAV UA: 1.014 (ref 1.005–1.030)
UUROB: 0.2 mg/dL (ref 0.2–1.0)
pH, UA: 6 (ref 5.0–7.5)

## 2017-10-24 DIAGNOSIS — R7301 Impaired fasting glucose: Secondary | ICD-10-CM | POA: Insufficient documentation

## 2017-10-24 DIAGNOSIS — R300 Dysuria: Secondary | ICD-10-CM | POA: Insufficient documentation

## 2017-10-24 DIAGNOSIS — Z1239 Encounter for other screening for malignant neoplasm of breast: Secondary | ICD-10-CM

## 2017-10-24 DIAGNOSIS — E559 Vitamin D deficiency, unspecified: Secondary | ICD-10-CM | POA: Insufficient documentation

## 2017-10-24 DIAGNOSIS — Z01818 Encounter for other preprocedural examination: Secondary | ICD-10-CM | POA: Insufficient documentation

## 2017-11-01 ENCOUNTER — Ambulatory Visit
Admission: RE | Admit: 2017-11-01 | Discharge: 2017-11-01 | Disposition: A | Discharge status: Home / Self Care | Payer: PPO | Source: Ambulatory Visit | Attending: Nurse Practitioner | Admitting: Nurse Practitioner

## 2017-11-01 DIAGNOSIS — Z1231 Encounter for screening mammogram for malignant neoplasm of breast: Secondary | ICD-10-CM | POA: Diagnosis not present

## 2017-11-01 DIAGNOSIS — Z1239 Encounter for other screening for malignant neoplasm of breast: Secondary | ICD-10-CM

## 2017-11-16 ENCOUNTER — Other Ambulatory Visit: Payer: Self-pay

## 2017-11-16 MED ORDER — VENLAFAXINE HCL ER 150 MG PO CP24: ORAL_CAPSULE | ORAL | 1 refills | Status: DC

## 2017-12-02 ENCOUNTER — Other Ambulatory Visit: Payer: Self-pay | Admitting: Nurse Practitioner

## 2017-12-02 DIAGNOSIS — E559 Vitamin D deficiency, unspecified: Secondary | ICD-10-CM | POA: Diagnosis not present

## 2017-12-02 DIAGNOSIS — Z0001 Encounter for general adult medical examination with abnormal findings: Secondary | ICD-10-CM | POA: Diagnosis not present

## 2017-12-02 DIAGNOSIS — I10 Essential (primary) hypertension: Secondary | ICD-10-CM | POA: Diagnosis not present

## 2017-12-02 DIAGNOSIS — R7301 Impaired fasting glucose: Secondary | ICD-10-CM | POA: Diagnosis not present

## 2017-12-03 LAB — COMPREHENSIVE METABOLIC PANEL
A/G RATIO: 2.3 — AB (ref 1.2–2.2)
ALBUMIN: 4.5 g/dL (ref 3.5–4.8)
ALK PHOS: 73 IU/L (ref 39–117)
ALT: 18 IU/L (ref 0–32)
AST: 15 IU/L (ref 0–40)
BILIRUBIN TOTAL: 0.5 mg/dL (ref 0.0–1.2)
BUN / CREAT RATIO: 15 (ref 12–28)
BUN: 11 mg/dL (ref 8–27)
CHLORIDE: 95 mmol/L — AB (ref 96–106)
CO2: 26 mmol/L (ref 20–29)
Calcium: 9.2 mg/dL (ref 8.7–10.3)
Creatinine, Ser: 0.71 mg/dL (ref 0.57–1.00)
GFR calc Af Amer: 100 mL/min/{1.73_m2} (ref >59)
GFR calc non Af Amer: 87 mL/min/{1.73_m2} (ref >59)
GLOBULIN, TOTAL: 2 g/dL (ref 1.5–4.5)
GLUCOSE: 131 mg/dL — AB (ref 65–99)
POTASSIUM: 4.2 mmol/L (ref 3.5–5.2)
SODIUM: 136 mmol/L (ref 134–144)
Total Protein: 6.5 g/dL (ref 6.0–8.5)

## 2017-12-03 LAB — CBC
Hematocrit: 39.4 % (ref 34.0–46.6)
Hemoglobin: 13.4 g/dL (ref 11.1–15.9)
MCH: 32.7 pg (ref 26.6–33.0)
MCHC: 34 g/dL (ref 31.5–35.7)
MCV: 96 fL (ref 79–97)
PLATELETS: 304 10*3/uL (ref 150–450)
RBC: 4.1 x10E6/uL (ref 3.77–5.28)
RDW: 12.5 % (ref 12.3–15.4)
WBC: 4.9 10*3/uL (ref 3.4–10.8)

## 2017-12-03 LAB — LIPID PANEL W/O CHOL/HDL RATIO
CHOLESTEROL TOTAL: 254 mg/dL — AB (ref 100–199)
HDL: 71 mg/dL (ref >39)
LDL Calculated: 168 mg/dL — ABNORMAL HIGH (ref 0–99)
TRIGLYCERIDES: 74 mg/dL (ref 0–149)
VLDL Cholesterol Cal: 15 mg/dL (ref 5–40)

## 2017-12-03 LAB — HGB A1C W/O EAG: Hgb A1c MFr Bld: 6.1 % — ABNORMAL HIGH (ref 4.8–5.6)

## 2017-12-03 LAB — TSH: TSH: 3.59 u[IU]/mL (ref 0.450–4.500)

## 2017-12-03 LAB — T4, FREE: Free T4: 1.12 ng/dL (ref 0.82–1.77)

## 2017-12-03 LAB — VITAMIN D 25 HYDROXY (VIT D DEFICIENCY, FRACTURES): VIT D 25 HYDROXY: 22.2 ng/mL — AB (ref 30.0–100.0)

## 2017-12-06 DIAGNOSIS — Z1212 Encounter for screening for malignant neoplasm of rectum: Secondary | ICD-10-CM | POA: Diagnosis not present

## 2017-12-06 DIAGNOSIS — Z1211 Encounter for screening for malignant neoplasm of colon: Secondary | ICD-10-CM | POA: Diagnosis not present

## 2017-12-06 LAB — COLOGUARD: Cologuard: NEGATIVE

## 2017-12-16 ENCOUNTER — Encounter: Payer: Self-pay | Admitting: Nurse Practitioner

## 2017-12-16 ENCOUNTER — Telehealth: Payer: Self-pay

## 2017-12-16 LAB — COLOGUARD

## 2017-12-16 NOTE — Progress Notes (Signed)
SCANNED IN COLOGUARD RESULTS REPORTED ON 12/11/17.

## 2017-12-16 NOTE — Telephone Encounter (Signed)
PT WAS NOTIFIED OF NEGATIVE COLOGUARD RESULT.

## 2017-12-28 ENCOUNTER — Other Ambulatory Visit: Payer: Self-pay | Admitting: Nurse Practitioner

## 2017-12-28 DIAGNOSIS — E559 Vitamin D deficiency, unspecified: Secondary | ICD-10-CM

## 2017-12-28 MED ORDER — ERGOCALCIFEROL 1.25 MG (50000 UT) PO CAPS: 50000.0000 [IU] | ORAL_CAPSULE | ORAL | 5 refills | Status: DC

## 2017-12-28 NOTE — Progress Notes (Signed)
Labs show vitamin d deficiency. Added drisdol once weekly for next 6 months. Sent to [facility]

## 2018-02-07 ENCOUNTER — Other Ambulatory Visit: Payer: Self-pay | Admitting: Internal Medicine

## 2018-03-15 ENCOUNTER — Encounter: Payer: Self-pay | Admitting: Nurse Practitioner

## 2018-03-16 ENCOUNTER — Encounter: Payer: Self-pay | Admitting: Nurse Practitioner

## 2018-03-16 ENCOUNTER — Ambulatory Visit (INDEPENDENT_AMBULATORY_CARE_PROVIDER_SITE_OTHER): Payer: PPO | Admitting: Nurse Practitioner

## 2018-03-16 VITALS — BP 152/76 | HR 77 | Resp 16 | Ht 63.0 in | Wt 147.0 lb

## 2018-03-16 DIAGNOSIS — R05 Cough: Secondary | ICD-10-CM | POA: Diagnosis not present

## 2018-03-16 DIAGNOSIS — R6889 Other general symptoms and signs: Secondary | ICD-10-CM | POA: Insufficient documentation

## 2018-03-16 DIAGNOSIS — J069 Acute upper respiratory infection, unspecified: Secondary | ICD-10-CM | POA: Insufficient documentation

## 2018-03-16 DIAGNOSIS — R059 Cough, unspecified: Secondary | ICD-10-CM | POA: Insufficient documentation

## 2018-03-16 LAB — POCT INFLUENZA A/B
INFLUENZA A, POC: NEGATIVE
INFLUENZA B, POC: NEGATIVE

## 2018-03-16 MED ORDER — SULFAMETHOXAZOLE-TRIMETHOPRIM 800-160 MG PO TABS: 1.0000 | ORAL_TABLET | Freq: Two times a day (BID) | ORAL | 0 refills | Status: DC

## 2018-03-16 MED ORDER — BENZONATATE 200 MG PO CAPS: 200.0000 mg | ORAL_CAPSULE | Freq: Two times a day (BID) | ORAL | 0 refills | Status: DC | PRN

## 2018-03-16 NOTE — Progress Notes (Signed)
[facility] [place], [place] [address]  Internal MEDICINE  Office Visit Note  Patient Name: Bonnie Washington  [id number]  [id number]  Date of Service: 03/16/2018   Pt is here for a sick visit.  Chief Complaint  Patient presents with  . Sinusitis  . Cough  . Headache     Sinusitis  This is a recurrent problem. The current episode started more than 1 month ago (symptoms have been off and on since thanksgiving. ). The problem has been waxing and waning since onset. The maximum temperature recorded prior to her arrival was 100.4 - 100.9 F. The fever has been present for less than 1 day. Her pain is at a severity of 3/10. The pain is mild. Associated symptoms include chills, congestion, coughing, ear pain, headaches, a hoarse voice, sneezing and a sore throat. Past treatments include acetaminophen, lying down, oral decongestants and saline sprays. The treatment provided mild relief.        Current Medication:  Outpatient Encounter Medications as of 03/16/2018  Medication Sig  . aspirin EC 81 MG tablet Take 81 mg by mouth daily.  . ergocalciferol (DRISDOL) 50000 units capsule Take 1 capsule (50,000 Units total) by mouth once a week.  . fluticasone (FLONASE) 50 MCG/ACT nasal spray INSTILL 1 SPRAY IN EACH NOSTRIL AT BEDTIME  . hydrochlorothiazide (MICROZIDE) 12.5 MG capsule TAKE ONE CAPSULE BY MOUTH EVERY DAY FOR BLOOD PRESSURE  . venlafaxine XR (EFFEXOR-XR) 150 MG 24 hr capsule TAKE 150 MG BY MOUTH DAILY  . benzonatate (TESSALON) 200 MG capsule Take 1 capsule (200 mg total) by mouth 2 (two) times daily as needed for cough.  . sulfamethoxazole-trimethoprim (BACTRIM DS,SEPTRA DS) 800-160 MG tablet Take 1 tablet by mouth 2 (two) times daily.   No facility-administered encounter medications on file as of 03/16/2018.       Medical History: Past Medical History:  Diagnosis Date  . Depression   . Diabetes mellitus without complication ([place])    pt use to take  metformin but has not taken it in 49yrs  . Hypertension    Today's Vitals   03/16/18 0838  BP: (!) 152/76  Pulse: 77  Resp: 16  SpO2: 98%  Weight: 147 lb (66.7 kg)  Height: 5\' 3"  (1.6 m)    Review of Systems  Constitutional: Positive for chills and fatigue. Negative for activity change.  HENT: Positive for congestion, ear pain, hoarse voice, postnasal drip, rhinorrhea, sinus pain, sneezing, sore throat and voice change.   Respiratory: Positive for cough. Negative for wheezing.   Cardiovascular: Negative for chest pain and palpitations.  Gastrointestinal: Negative for constipation, diarrhea and vomiting.  Musculoskeletal: Negative.   Skin: Negative.   Allergic/Immunologic: Positive for environmental allergies.  Neurological: Positive for headaches.  Hematological: Positive for adenopathy.  Psychiatric/Behavioral: Negative.     Physical Exam Vitals signs and nursing note reviewed.  Constitutional:      General: She is not in acute distress.    Appearance: She is well-developed. She is ill-appearing. She is not diaphoretic.  HENT:     Head: Normocephalic and atraumatic.     Right Ear: Tympanic membrane is bulging.     Left Ear: Tympanic membrane is bulging.     Nose: Congestion and rhinorrhea present. Rhinorrhea is clear.     Right Sinus: Frontal sinus tenderness present.     Left Sinus: Frontal sinus tenderness present.     Mouth/Throat:     Pharynx: No oropharyngeal exudate.  Eyes:  Pupils: Pupils are equal, round, and reactive to light.  Neck:     Musculoskeletal: Normal range of motion and neck supple.     Thyroid: No thyromegaly.     Vascular: No JVD.     Trachea: No tracheal deviation.  Cardiovascular:     Rate and Rhythm: Normal rate and regular rhythm.     Heart sounds: Normal heart sounds. No murmur. No friction rub. No gallop.   Pulmonary:     Effort: Pulmonary effort is normal. No respiratory distress.     Breath sounds: Normal breath sounds. No wheezing  or rales.  Chest:     Chest wall: No tenderness.  Abdominal:     General: Bowel sounds are normal.     Palpations: Abdomen is soft.  Musculoskeletal: Normal range of motion.  Lymphadenopathy:     Cervical: Cervical adenopathy present.  Skin:    General: Skin is warm and dry.  Neurological:     Mental Status: She is alert and oriented to person, place, and time.     Cranial Nerves: No cranial nerve deficit.  Psychiatric:        Behavior: Behavior normal.        Thought Content: Thought content normal.        Judgment: Judgment normal.    Assessment/Plan: 1. Flu-like symptoms - POCT Influenza A/B negative   2. Acute upper respiratory infection Start bactrim ds bid for 10 days. Rest and increase fluids. Continue OTC medication to reduce symptoms.  - sulfamethoxazole-trimethoprim (BACTRIM DS,SEPTRA DS) 800-160 MG tablet; Take 1 tablet by mouth 2 (two) times daily.  Dispense: 20 tablet; Refill: 0  3. Cough Add tessalon perls. May take up to three times daily as needed for cough.  - benzonatate (TESSALON) 200 MG capsule; Take 1 capsule (200 mg total) by mouth 2 (two) times daily as needed for cough.  Dispense: 20 capsule; Refill: 0  General Counseling: Ladell verbalizes understanding of the findings of todays visit and agrees with plan of treatment. I have discussed any further diagnostic evaluation that may be needed or ordered today. We also reviewed her medications today. she has been encouraged to call the office with any questions or concerns that should arise related to todays visit.    Counseling:  Rest and increase fluids. Continue using OTC medication to control symptoms.   This patient was seen by Leretha Pol FNP Collaboration with Dr Lavera Guise as a part of collaborative care agreement  Orders Placed This Encounter  Procedures  . POCT Influenza A/B    Meds ordered this encounter  Medications  . sulfamethoxazole-trimethoprim (BACTRIM DS,SEPTRA DS) 800-160 MG  tablet    Sig: Take 1 tablet by mouth 2 (two) times daily.    Dispense:  20 tablet    Refill:  0    Order Specific Question:   Supervising Provider    Answer:   Lavera Guise [[id number]]  . benzonatate (TESSALON) 200 MG capsule    Sig: Take 1 capsule (200 mg total) by mouth 2 (two) times daily as needed for cough.    Dispense:  20 capsule    Refill:  0    Order Specific Question:   Supervising Provider    Answer:   Lavera Guise [[id number]]    Time spent: 25 Minutes

## 2018-03-20 ENCOUNTER — Other Ambulatory Visit: Payer: Self-pay

## 2018-03-20 MED ORDER — HYDROCHLOROTHIAZIDE 12.5 MG PO CAPS: ORAL_CAPSULE | ORAL | 1 refills | Status: DC

## 2018-03-23 ENCOUNTER — Encounter: Payer: Self-pay | Admitting: Nurse Practitioner

## 2018-03-23 ENCOUNTER — Ambulatory Visit (INDEPENDENT_AMBULATORY_CARE_PROVIDER_SITE_OTHER): Payer: PPO | Admitting: Nurse Practitioner

## 2018-03-23 VITALS — BP 159/90 | HR 60 | Temp 98.6°F | Resp 16 | Ht 63.5 in | Wt 144.8 lb

## 2018-03-23 DIAGNOSIS — R059 Cough, unspecified: Secondary | ICD-10-CM

## 2018-03-23 DIAGNOSIS — R05 Cough: Secondary | ICD-10-CM

## 2018-03-23 DIAGNOSIS — I10 Essential (primary) hypertension: Secondary | ICD-10-CM

## 2018-03-23 DIAGNOSIS — J069 Acute upper respiratory infection, unspecified: Secondary | ICD-10-CM | POA: Diagnosis not present

## 2018-03-23 DIAGNOSIS — R509 Fever, unspecified: Secondary | ICD-10-CM | POA: Diagnosis not present

## 2018-03-23 LAB — POC INFLUENZA TEST
Negative: NEGATIVE
Positive: NEGATIVE

## 2018-03-23 MED ORDER — LEVOFLOXACIN 500 MG PO TABS: 500.0000 mg | ORAL_TABLET | Freq: Every day | ORAL | 0 refills | Status: DC

## 2018-03-23 MED ORDER — METHYLPREDNISOLONE 4 MG PO TBPK: ORAL_TABLET | ORAL | 0 refills | Status: DC

## 2018-03-23 NOTE — Progress Notes (Signed)
[facility] [place], [place] [address]  Internal MEDICINE  Office Visit Note  Patient Name: Bonnie Washington  [id number]  [id number]  Date of Service: 03/23/2018  Chief Complaint  Patient presents with  . Chills    pt stated symptoms is no better after taking antiobiotics from last visit she has had fever and chills, body aches, and has not been around anyone sickk that she know of  . Fever     The patient is here for sick visit. Has congestion, headache, post nasal drip, and body caches. Was seen on 03/16/2018 for the same symptoms. Was negative for flu. She was treated with bactrim DS twice daily for ten days. Is taking tylenol and ibuprofen to help pain and fever. Still taking tessalon to relieve cough. Today, she states that she is feeling worse. Has missed all week of work due to the severity of her symptoms. Body aches are most severe. States she feels like she has the flu.   Pt is here for a sick visit.     Current Medication:  Outpatient Encounter Medications as of 03/23/2018  Medication Sig  . aspirin EC 81 MG tablet Take 81 mg by mouth daily.  . benzonatate (TESSALON) 200 MG capsule Take 1 capsule (200 mg total) by mouth 2 (two) times daily as needed for cough.  . ergocalciferol (DRISDOL) 50000 units capsule Take 1 capsule (50,000 Units total) by mouth once a week.  . fluticasone (FLONASE) 50 MCG/ACT nasal spray INSTILL 1 SPRAY IN EACH NOSTRIL AT BEDTIME  . hydrochlorothiazide (MICROZIDE) 12.5 MG capsule TAKE ONE CAPSULE BY MOUTH EVERY DAY FOR BLOOD PRESSURE  . levofloxacin (LEVAQUIN) 500 MG tablet Take 1 tablet (500 mg total) by mouth daily.  . methylPREDNISolone (MEDROL) 4 MG TBPK tablet Take by mouth as directed for 6 days  . venlafaxine XR (EFFEXOR-XR) 150 MG 24 hr capsule TAKE 150 MG BY MOUTH DAILY  . [DISCONTINUED] sulfamethoxazole-trimethoprim (BACTRIM DS,SEPTRA DS) 800-160 MG tablet Take 1 tablet by mouth 2 (two) times daily.   No  facility-administered encounter medications on file as of 03/23/2018.       Medical History: Past Medical History:  Diagnosis Date  . Depression   . Diabetes mellitus without complication ([place])    pt use to take metformin but has not taken it in 40yrs  . Hypertension     Today's Vitals   03/23/18 1104  BP: (!) 159/90  Pulse: 60  Resp: 16  Temp: 98.6 F (37 C)  SpO2: 96%  Weight: 144 lb 12.8 oz (65.7 kg)  Height: 5' 3.5" (1.613 m)    Review of Systems  Constitutional: Positive for activity change, chills, fatigue and fever.  HENT: Positive for congestion, ear pain, postnasal drip, rhinorrhea, sinus pain, sneezing, sore throat and voice change.   Respiratory: Positive for cough. Negative for shortness of breath and wheezing.   Cardiovascular: Negative for chest pain and palpitations.  Gastrointestinal: Positive for nausea. Negative for constipation, diarrhea and vomiting.  Musculoskeletal: Positive for arthralgias and myalgias.  Allergic/Immunologic: Positive for environmental allergies.  Neurological: Positive for headaches.  Hematological: Positive for adenopathy.    Physical Exam Vitals signs and nursing note reviewed.  Constitutional:      General: She is not in acute distress.    Appearance: She is well-developed. She is ill-appearing. She is not diaphoretic.  HENT:     Head: Normocephalic and atraumatic.     Right Ear: Tympanic membrane is bulging.  Left Ear: Tympanic membrane is bulging.     Nose: Congestion and rhinorrhea present. Rhinorrhea is clear.     Right Sinus: Frontal sinus tenderness present.     Left Sinus: Frontal sinus tenderness present.     Mouth/Throat:     Pharynx: Posterior oropharyngeal erythema present. No oropharyngeal exudate.  Eyes:     Pupils: Pupils are equal, round, and reactive to light.  Neck:     Musculoskeletal: Normal range of motion and neck supple.     Thyroid: No thyromegaly.     Vascular: No JVD.     Trachea: No  tracheal deviation.  Cardiovascular:     Rate and Rhythm: Normal rate and regular rhythm.     Heart sounds: Normal heart sounds. No murmur. No friction rub. No gallop.   Pulmonary:     Effort: Pulmonary effort is normal. No respiratory distress.     Breath sounds: Normal breath sounds. No wheezing or rales.  Chest:     Chest wall: No tenderness.  Abdominal:     General: Bowel sounds are normal.     Palpations: Abdomen is soft.  Musculoskeletal: Normal range of motion.  Lymphadenopathy:     Cervical: Cervical adenopathy present.  Skin:    General: Skin is warm and dry.  Neurological:     Mental Status: She is alert and oriented to person, place, and time.     Cranial Nerves: No cranial nerve deficit.  Psychiatric:        Behavior: Behavior normal.        Thought Content: Thought content normal.        Judgment: Judgment normal.   Assessment/Plan: 1. Fever and chills - POC INFLUENZA TEST  Negative today. Continue to rest and increase fluids. Take OTC tylenol/ibuprofen to help relieve fever and body aches.   2. Acute upper respiratory infection Start levofloxacin 500mg  daily for seven days. Add medrol dose pack. Take as directed for 6 days. Continue to rest and increase fluids. Take OTC tylenol/ibuprofen to help relieve fever and body aches.  - levofloxacin (LEVAQUIN) 500 MG tablet; Take 1 tablet (500 mg total) by mouth daily.  Dispense: 7 tablet; Refill: 0 - methylPREDNISolone (MEDROL) 4 MG TBPK tablet; Take by mouth as directed for 6 days  Dispense: 21 tablet; Refill: 0  3. Cough May use tessalon as needed and as prescribed to help cough.   4. Essential hypertension Continue BP medication as prescribed   General Counseling: Karma verbalizes understanding of the findings of todays visit and agrees with plan of treatment. I have discussed any further diagnostic evaluation that may be needed or ordered today. We also reviewed her medications today. she has been encouraged to call  the office with any questions or concerns that should arise related to todays visit.    Counseling:  Rest and increase fluids. Continue using OTC medication to control symptoms.   This patient was seen by Leretha Pol FNP Collaboration with Dr Lavera Guise as a part of collaborative care agreement  Orders Placed This Encounter  Procedures  . POC INFLUENZA TEST    Meds ordered this encounter  Medications  . levofloxacin (LEVAQUIN) 500 MG tablet    Sig: Take 1 tablet (500 mg total) by mouth daily.    Dispense:  7 tablet    Refill:  0    Order Specific Question:   Supervising Provider    Answer:   Lavera Guise [[id number]]  . methylPREDNISolone (MEDROL) 4 MG TBPK  tablet    Sig: Take by mouth as directed for 6 days    Dispense:  21 tablet    Refill:  0    Order Specific Question:   Supervising Provider    Answer:   Lavera Guise [[id number]]    Time spent: 25 Minutes

## 2018-04-17 ENCOUNTER — Ambulatory Visit (INDEPENDENT_AMBULATORY_CARE_PROVIDER_SITE_OTHER): Payer: PPO | Admitting: Nurse Practitioner

## 2018-04-17 ENCOUNTER — Encounter: Payer: Self-pay | Admitting: Nurse Practitioner

## 2018-04-17 VITALS — BP 130/80 | HR 76 | Resp 16 | Ht 64.0 in | Wt 147.0 lb

## 2018-04-17 DIAGNOSIS — J069 Acute upper respiratory infection, unspecified: Secondary | ICD-10-CM | POA: Diagnosis not present

## 2018-04-17 DIAGNOSIS — R1032 Left lower quadrant pain: Secondary | ICD-10-CM

## 2018-04-17 DIAGNOSIS — I10 Essential (primary) hypertension: Secondary | ICD-10-CM

## 2018-04-17 MED ORDER — METHYLPREDNISOLONE 4 MG PO TBPK: ORAL_TABLET | ORAL | 0 refills | Status: DC

## 2018-04-17 NOTE — Progress Notes (Signed)
[facility] [place], [place] [address]  Internal MEDICINE  Office Visit Note  Patient Name: Bonnie Washington  [id number]  [id number]  Date of Service: 04/23/2018  Chief Complaint  Patient presents with  . Depression  . Groin Pain    and goiing in her left leg  . Hypertension    The patient has been having left groin pain. Pain is more severe when lifting the left leg at the hip and with exertion. It generally resolves with rest. She denies injury or trauna to the left hip or leg. She is able to walk without difficulty.  She is feeling mostly better after two rounds of antibiotics for upper respiratory infection. She does continue to have some nasal congestion and a mild cough which is non productive       Current Medication: Outpatient Encounter Medications as of 04/17/2018  Medication Sig  . aspirin EC 81 MG tablet Take 81 mg by mouth daily.  . benzonatate (TESSALON) 200 MG capsule Take 1 capsule (200 mg total) by mouth 2 (two) times daily as needed for cough.  . ergocalciferol (DRISDOL) 50000 units capsule Take 1 capsule (50,000 Units total) by mouth once a week.  . fluticasone (FLONASE) 50 MCG/ACT nasal spray INSTILL 1 SPRAY IN EACH NOSTRIL AT BEDTIME  . hydrochlorothiazide (MICROZIDE) 12.5 MG capsule TAKE ONE CAPSULE BY MOUTH EVERY DAY FOR BLOOD PRESSURE  . methylPREDNISolone (MEDROL) 4 MG TBPK tablet Take by mouth as directed for 6 days  . venlafaxine XR (EFFEXOR-XR) 150 MG 24 hr capsule TAKE 150 MG BY MOUTH DAILY  . [DISCONTINUED] levofloxacin (LEVAQUIN) 500 MG tablet Take 1 tablet (500 mg total) by mouth daily.  . [DISCONTINUED] methylPREDNISolone (MEDROL) 4 MG TBPK tablet Take by mouth as directed for 6 days   No facility-administered encounter medications on file as of 04/17/2018.     Surgical History: Past Surgical History:  Procedure Laterality Date  . CHOLECYSTECTOMY  2014  . FRACTURE SURGERY Right 2004   elbow  . OPEN REDUCTION INTERNAL  FIXATION (ORIF) DISTAL RADIAL FRACTURE Left 10/06/2015   Procedure: OPEN REDUCTION INTERNAL FIXATION (ORIF) DISTAL RADIAL FRACTURE;  Surgeon: Earnestine Leys, MD;  Location: [facility] ORS;  Service: Orthopedics;  Laterality: Left;  Marland Kitchen VENTRAL HERNIA REPAIR N/A 06/10/2017   Procedure: HERNIA REPAIR VENTRAL ADULT;  Surgeon: Robert Bellow, MD;  Location: [facility] ORS;  Service: General;  Laterality: N/A;    Medical History: Past Medical History:  Diagnosis Date  . Depression   . Diabetes mellitus without complication ([place])    pt use to take metformin but has not taken it in 52yrs  . Hypertension     Family History: Family History  Problem Relation Age of Onset  . Hypertension Mother   . Breast cancer Cousin     Social History   Socioeconomic History  . Marital status: Married    Spouse name: Not on file  . Number of children: Not on file  . Years of education: Not on file  . Highest education level: Not on file  Occupational History  . Not on file  Social Needs  . Financial resource strain: Not on file  . Food insecurity:    Worry: Not on file    Inability: Not on file  . Transportation needs:    Medical: Not on file    Non-medical: Not on file  Tobacco Use  . Smoking status: Former Smoker    Packs/day: 1.00    Types: Cigarettes  Last attempt to quit: 03/08/1993    Years since quitting: 25.1  . Smokeless tobacco: Never Used  Substance and Sexual Activity  . Alcohol use: Yes    Alcohol/week: 4.0 standard drinks    Types: 4 Cans of beer per week  . Drug use: No  . Sexual activity: Not on file  Lifestyle  . Physical activity:    Days per week: Not on file    Minutes per session: Not on file  . Stress: Not on file  Relationships  . Social connections:    Talks on phone: Not on file    Gets together: Not on file    Attends religious service: Not on file    Active member of club or organization: Not on file    Attends meetings of clubs or organizations: Not on file     Relationship status: Not on file  . Intimate partner violence:    Fear of current or ex partner: Not on file    Emotionally abused: Not on file    Physically abused: Not on file    Forced sexual activity: Not on file  Other Topics Concern  . Not on file  Social History Narrative  . Not on file      Review of Systems  Constitutional: Negative for activity change, chills, fatigue and unexpected weight change.  HENT: Positive for congestion and postnasal drip. Negative for rhinorrhea, sneezing and sore throat.   Respiratory: Positive for cough and wheezing. Negative for chest tightness and shortness of breath.   Cardiovascular: Negative for chest pain and palpitations.  Gastrointestinal: Negative for abdominal pain, constipation, diarrhea, nausea and vomiting.  Endocrine: Negative for cold intolerance, heat intolerance, polydipsia and polyuria.  Musculoskeletal: Positive for arthralgias and myalgias. Negative for back pain, joint swelling and neck pain.       Left groin tenderness, especialy when lifting the left leg at the hip.   Skin: Negative for color change, pallor, rash and wound.  Allergic/Immunologic: Positive for environmental allergies. Negative for food allergies and immunocompromised state.  Neurological: Negative for dizziness, tremors, facial asymmetry and numbness.  Hematological: Negative for adenopathy. Does not bruise/bleed easily.  Psychiatric/Behavioral: Positive for dysphoric mood. Negative for behavioral problems (Depression), sleep disturbance and suicidal ideas. The patient is nervous/anxious.     Today's Vitals   04/17/18 1028  BP: 130/80  Pulse: 76  Resp: 16  SpO2: 98%  Weight: 147 lb (66.7 kg)  Height: 5\' 4"  (1.626 m)   Body mass index is 25.23 kg/m.  Physical Exam Vitals signs and nursing note reviewed.  Constitutional:      General: She is not in acute distress.    Appearance: Normal appearance. She is well-developed. She is not diaphoretic.   HENT:     Head: Normocephalic and atraumatic.     Right Ear: External ear normal.     Left Ear: External ear normal.     Nose: Nose normal.     Mouth/Throat:     Mouth: Mucous membranes are moist.     Pharynx: No oropharyngeal exudate.  Eyes:     Conjunctiva/sclera: Conjunctivae normal.     Pupils: Pupils are equal, round, and reactive to light.  Neck:     Musculoskeletal: Normal range of motion and neck supple.     Thyroid: No thyromegaly.     Vascular: No carotid bruit or JVD.     Trachea: No tracheal deviation.  Cardiovascular:     Rate and Rhythm: Normal rate and  regular rhythm.     Heart sounds: Normal heart sounds. No murmur. No friction rub. No gallop.   Pulmonary:     Effort: Pulmonary effort is normal. No respiratory distress.     Breath sounds: Normal breath sounds. No wheezing or rales.     Comments: Intermittent, congested, non-productive cough noted.  Chest:     Chest wall: No tenderness.     Breasts:        Right: No inverted nipple, mass, nipple discharge, skin change or tenderness.        Left: No inverted nipple, mass, nipple discharge, skin change or tenderness.  Abdominal:     General: Bowel sounds are normal.     Palpations: Abdomen is soft.     Tenderness: There is no abdominal tenderness.  Musculoskeletal: Normal range of motion.     Comments: Left groin tenderness with lifting the leg at the hip. There is no bony abnormalities or deformities present at this time.   Lymphadenopathy:     Cervical: No cervical adenopathy.  Skin:    General: Skin is warm and dry.     Capillary Refill: Capillary refill takes less than 2 seconds.  Neurological:     Mental Status: She is alert and oriented to person, place, and time.     Cranial Nerves: No cranial nerve deficit.  Psychiatric:        Behavior: Behavior normal.        Thought Content: Thought content normal.        Judgment: Judgment normal.   Assessment/Plan: 1. Acute upper respiratory  infection Improving. Repeat medrol dose pack. Take as directed for 6 days.  - methylPREDNISolone (MEDROL) 4 MG TBPK tablet; Take by mouth as directed for 6 days  Dispense: 21 tablet; Refill: 0  2. Left groin pain Will x-ray left hip and pelvis for further evaluation. Take NSAIDs and/or tylenol as needed and as indicated. Medrol dose pack may improve pain and inflammation. Consider referral to orthopedics as indicated.  - DG Hip Unilat W OR W/O Pelvis 2-3 Views Left; Future  3. Essential hypertension Stable. Continue bp medication as prescribed.  General Counseling: Ceira verbalizes understanding of the findings of todays visit and agrees with plan of treatment. I have discussed any further diagnostic evaluation that may be needed or ordered today. We also reviewed her medications today. she has been encouraged to call the office with any questions or concerns that should arise related to todays visit.  This patient was seen by Leretha Pol FNP Collaboration with Dr Lavera Guise as a part of collaborative care agreement    Orders Placed This Encounter  Procedures  . DG Hip Unilat W OR W/O Pelvis 2-3 Views Left    Meds ordered this encounter  Medications  . methylPREDNISolone (MEDROL) 4 MG TBPK tablet    Sig: Take by mouth as directed for 6 days    Dispense:  21 tablet    Refill:  0    Order Specific Question:   Supervising Provider    Answer:   Lavera Guise [[id number]]    Time spent: 94 Minutes      Dr Lavera Guise Internal medicine

## 2018-04-23 DIAGNOSIS — R1032 Left lower quadrant pain: Secondary | ICD-10-CM | POA: Insufficient documentation

## 2018-04-24 ENCOUNTER — Ambulatory Visit
Admission: RE | Admit: 2018-04-24 | Discharge: 2018-04-24 | Disposition: A | Discharge status: Home / Self Care | Payer: PPO | Source: Ambulatory Visit | Attending: Nurse Practitioner | Admitting: Nurse Practitioner

## 2018-04-24 ENCOUNTER — Ambulatory Visit
Admission: RE | Admit: 2018-04-24 | Discharge: 2018-04-24 | Disposition: A | Discharge status: Home / Self Care | Payer: PPO | Attending: Nurse Practitioner | Admitting: Nurse Practitioner

## 2018-04-24 DIAGNOSIS — M1612 Unilateral primary osteoarthritis, left hip: Secondary | ICD-10-CM | POA: Diagnosis not present

## 2018-04-24 DIAGNOSIS — R1032 Left lower quadrant pain: Secondary | ICD-10-CM

## 2018-05-01 ENCOUNTER — Telehealth: Payer: Self-pay

## 2018-05-01 NOTE — Telephone Encounter (Signed)
APPT WAS MADE ON 05/02/2018 AT 11:45 AND PT WAS NOTIFIED.

## 2018-05-01 NOTE — Telephone Encounter (Signed)
Yes, maybe have her see Adam for a different set of eyes on the situation. She may need PFTs or chest x-ray. Also, please let her know that hip x-ray shows some mild degenerative changes in her hip, but was otherwise without abnormalities. Thanks.

## 2018-05-02 ENCOUNTER — Encounter: Payer: Self-pay | Admitting: Adult Health

## 2018-05-02 ENCOUNTER — Ambulatory Visit (INDEPENDENT_AMBULATORY_CARE_PROVIDER_SITE_OTHER): Payer: PPO | Admitting: Adult Health

## 2018-05-02 VITALS — BP 146/72 | HR 77 | Temp 98.1°F | Resp 16 | Ht 64.0 in | Wt 150.0 lb

## 2018-05-02 DIAGNOSIS — I10 Essential (primary) hypertension: Secondary | ICD-10-CM | POA: Diagnosis not present

## 2018-05-02 DIAGNOSIS — J40 Bronchitis, not specified as acute or chronic: Secondary | ICD-10-CM

## 2018-05-02 DIAGNOSIS — R059 Cough, unspecified: Secondary | ICD-10-CM

## 2018-05-02 DIAGNOSIS — R05 Cough: Secondary | ICD-10-CM

## 2018-05-02 NOTE — Patient Instructions (Signed)

## 2018-05-02 NOTE — Progress Notes (Signed)
[facility] [place], [place] [address]  Internal MEDICINE  Office Visit Note  Patient Name: Bonnie Washington  [id number]  [id number]  Date of Service: 05/02/2018  Chief Complaint  Patient presents with  . Cough    nasal congestion , since november , 2 rounds of antibiotics and two rounds of prednisone     HPI Pt is here for a sick visit.  Patient reports over the last 3 months she has had nasal congestion and sinus pain and pressure.  She also has developed a cough.  She had 2 rounds of antibiotics and 2 rounds of prednisone.  She reports that she gets mildly better between antibiotics and prednisone tapers however she does not seem to get well.  Patient does mention that she is feeling better now that she did a week or 2 ago.  She is randomly taking over-the-counter medications but nothing consistently.     Current Medication:  Outpatient Encounter Medications as of 05/02/2018  Medication Sig  . aspirin EC 81 MG tablet Take 81 mg by mouth daily.  . benzonatate (TESSALON) 200 MG capsule Take 1 capsule (200 mg total) by mouth 2 (two) times daily as needed for cough.  . ergocalciferol (DRISDOL) 50000 units capsule Take 1 capsule (50,000 Units total) by mouth once a week.  . fluticasone (FLONASE) 50 MCG/ACT nasal spray INSTILL 1 SPRAY IN EACH NOSTRIL AT BEDTIME  . hydrochlorothiazide (MICROZIDE) 12.5 MG capsule TAKE ONE CAPSULE BY MOUTH EVERY DAY FOR BLOOD PRESSURE  . venlafaxine XR (EFFEXOR-XR) 150 MG 24 hr capsule TAKE 150 MG BY MOUTH DAILY  . [DISCONTINUED] methylPREDNISolone (MEDROL) 4 MG TBPK tablet Take by mouth as directed for 6 days (Patient not taking: Reported on 05/02/2018)   No facility-administered encounter medications on file as of 05/02/2018.       Medical History: Past Medical History:  Diagnosis Date  . Depression   . Diabetes mellitus without complication ([place])    pt use to take metformin but has not taken it in 43yrs  . Hypertension       Vital Signs: BP (!) 146/72   Pulse 77   Temp 98.1 F (36.7 C) (Oral)   Resp 16   Ht 5\' 4"  (1.626 m)   Wt 150 lb (68 kg)   SpO2 92%   BMI 25.75 kg/m    Review of Systems  Constitutional: Negative for chills, fatigue and unexpected weight change.  HENT: Negative for congestion, rhinorrhea, sneezing and sore throat.   Eyes: Negative for photophobia, pain and redness.  Respiratory: Negative for cough, chest tightness and shortness of breath.   Cardiovascular: Negative for chest pain and palpitations.  Gastrointestinal: Negative for abdominal pain, constipation, diarrhea, nausea and vomiting.  Endocrine: Negative.   Genitourinary: Negative for dysuria and frequency.  Musculoskeletal: Negative for arthralgias, back pain, joint swelling and neck pain.  Skin: Negative for rash.  Allergic/Immunologic: Negative.   Neurological: Negative for tremors and numbness.  Hematological: Negative for adenopathy. Does not bruise/bleed easily.  Psychiatric/Behavioral: Negative for behavioral problems and sleep disturbance. The patient is not nervous/anxious.     Physical Exam Vitals signs and nursing note reviewed.  Constitutional:      General: She is not in acute distress.    Appearance: She is well-developed. She is not diaphoretic.  HENT:     Head: Normocephalic and atraumatic.     Mouth/Throat:     Pharynx: No oropharyngeal exudate.  Eyes:     Pupils: Pupils  are equal, round, and reactive to light.  Neck:     Musculoskeletal: Normal range of motion and neck supple.     Thyroid: No thyromegaly.     Vascular: No JVD.     Trachea: No tracheal deviation.  Cardiovascular:     Rate and Rhythm: Normal rate and regular rhythm.     Heart sounds: Normal heart sounds. No murmur. No friction rub. No gallop.   Pulmonary:     Effort: Pulmonary effort is normal. No respiratory distress.     Breath sounds: Normal breath sounds. No wheezing or rales.  Chest:     Chest wall: No tenderness.   Abdominal:     Palpations: Abdomen is soft.     Tenderness: There is no abdominal tenderness. There is no guarding.  Musculoskeletal: Normal range of motion.  Lymphadenopathy:     Cervical: No cervical adenopathy.  Skin:    General: Skin is warm and dry.  Neurological:     Mental Status: She is alert and oriented to person, place, and time.     Cranial Nerves: No cranial nerve deficit.  Psychiatric:        Behavior: Behavior normal.        Thought Content: Thought content normal.        Judgment: Judgment normal.    Assessment/Plan: 1. Bronchitis Patient report her husband was diagnosed with bronchitis recently.  I explained to her current literature on bronchitis and antibiotics.  I explained that if she continued to have cough as well as sinus drainage that we would consider putting her on another antibiotic.  However she is completed a course of Levaquin approximately 2 to 3 weeks ago and I do not believe that she needs antibiotics at this time.  She will keep in touch with [place] and call if she develops a fever or if her symptoms do not improve with round-the-clock OTC medications such as DayQuil NyQuil over the next 3 to 5 days.  2. Cough We discussed the possibility that her cough could be the byproduct of GERD.  She denies any GERD symptoms ever in her life.  And states that she is never had any heartburn.  Offered an upper GI explained that she did not have traditional symptoms that cough could be a symptom of it.  She declined at this time and would like to try to see if OTC medication will make a difference.  3. Essential hypertension Patient's blood pressure 146/72.  Slightly elevated today.  Likely due to her viral infection.  We will continue to follow-up with her future visits on her blood pressure.  General Counseling: Arisa verbalizes understanding of the findings of todays visit and agrees with plan of treatment. I have discussed any further diagnostic evaluation that may  be needed or ordered today. We also reviewed her medications today. she has been encouraged to call the office with any questions or concerns that should arise related to todays visit.  He denies any recent fever or chills.   No orders of the defined types were placed in this encounter.   No orders of the defined types were placed in this encounter.   Time spent: 25 Minutes  This patient was seen by Orson Gear AGNP-C in Collaboration with Dr Lavera Guise as a part of collaborative care agreement.  Kendell Bane AGNP-C Internal Medicine

## 2018-05-09 DIAGNOSIS — M5416 Radiculopathy, lumbar region: Secondary | ICD-10-CM | POA: Diagnosis not present

## 2018-05-09 DIAGNOSIS — M1710 Unilateral primary osteoarthritis, unspecified knee: Secondary | ICD-10-CM | POA: Diagnosis not present

## 2018-05-09 DIAGNOSIS — M1712 Unilateral primary osteoarthritis, left knee: Secondary | ICD-10-CM | POA: Diagnosis not present

## 2018-05-10 ENCOUNTER — Other Ambulatory Visit: Payer: Self-pay

## 2018-05-10 DIAGNOSIS — E559 Vitamin D deficiency, unspecified: Secondary | ICD-10-CM

## 2018-05-10 DIAGNOSIS — M13862 Other specified arthritis, left knee: Secondary | ICD-10-CM | POA: Diagnosis not present

## 2018-05-10 MED ORDER — ERGOCALCIFEROL 1.25 MG (50000 UT) PO CAPS: 50000.0000 [IU] | ORAL_CAPSULE | ORAL | 5 refills | Status: DC

## 2018-05-11 ENCOUNTER — Other Ambulatory Visit: Payer: Self-pay

## 2018-05-11 MED ORDER — VENLAFAXINE HCL ER 150 MG PO CP24: ORAL_CAPSULE | ORAL | 1 refills | Status: DC

## 2018-08-14 DIAGNOSIS — M1710 Unilateral primary osteoarthritis, unspecified knee: Secondary | ICD-10-CM | POA: Diagnosis not present

## 2018-08-29 DIAGNOSIS — S83207A Unspecified tear of unspecified meniscus, current injury, left knee, initial encounter: Secondary | ICD-10-CM | POA: Diagnosis not present

## 2018-08-31 ENCOUNTER — Other Ambulatory Visit: Payer: Self-pay

## 2018-08-31 DIAGNOSIS — S83242D Other tear of medial meniscus, current injury, left knee, subsequent encounter: Secondary | ICD-10-CM | POA: Diagnosis not present

## 2018-08-31 DIAGNOSIS — Z01818 Encounter for other preprocedural examination: Secondary | ICD-10-CM | POA: Diagnosis not present

## 2018-09-04 ENCOUNTER — Ambulatory Visit: Payer: Self-pay | Admitting: Orthopedic Surgery

## 2018-09-04 ENCOUNTER — Other Ambulatory Visit
Admission: RE | Admit: 2018-09-04 | Discharge: 2018-09-04 | Disposition: A | Discharge status: Home / Self Care | Payer: PPO | Source: Ambulatory Visit | Attending: Orthopedic Surgery | Admitting: Orthopedic Surgery

## 2018-09-14 ENCOUNTER — Other Ambulatory Visit: Payer: Self-pay

## 2018-09-14 ENCOUNTER — Encounter: Payer: Self-pay | Admitting: Nurse Practitioner

## 2018-09-14 ENCOUNTER — Ambulatory Visit (INDEPENDENT_AMBULATORY_CARE_PROVIDER_SITE_OTHER): Payer: PPO | Admitting: Nurse Practitioner

## 2018-09-14 VITALS — BP 153/77 | HR 77 | Temp 97.5°F | Resp 16 | Ht 64.0 in | Wt 145.2 lb

## 2018-09-14 DIAGNOSIS — N390 Urinary tract infection, site not specified: Secondary | ICD-10-CM | POA: Diagnosis not present

## 2018-09-14 DIAGNOSIS — R7301 Impaired fasting glucose: Secondary | ICD-10-CM

## 2018-09-14 DIAGNOSIS — I10 Essential (primary) hypertension: Secondary | ICD-10-CM

## 2018-09-14 DIAGNOSIS — S83242D Other tear of medial meniscus, current injury, left knee, subsequent encounter: Secondary | ICD-10-CM

## 2018-09-14 DIAGNOSIS — Z01818 Encounter for other preprocedural examination: Secondary | ICD-10-CM

## 2018-09-14 DIAGNOSIS — R300 Dysuria: Secondary | ICD-10-CM

## 2018-09-14 DIAGNOSIS — S83242A Other tear of medial meniscus, current injury, left knee, initial encounter: Secondary | ICD-10-CM | POA: Insufficient documentation

## 2018-09-14 LAB — POCT GLYCOSYLATED HEMOGLOBIN (HGB A1C): Hemoglobin A1C: 5.8 % — AB (ref 4.0–5.6)

## 2018-09-14 LAB — POCT URINALYSIS DIPSTICK
Bilirubin, UA: NEGATIVE
Blood, UA: NEGATIVE
Glucose, UA: NEGATIVE
Ketones, UA: NEGATIVE
Nitrite, UA: NEGATIVE
Protein, UA: POSITIVE — AB
Spec Grav, UA: 1.01 (ref 1.010–1.025)
Urobilinogen, UA: 0.2 E.U./dL
pH, UA: 7.5 (ref 5.0–8.0)

## 2018-09-14 NOTE — Addendum Note (Signed)
Addended by: Corlis Hove on: 09/14/2018 04:41 PM   Modules accepted: Orders

## 2018-09-14 NOTE — Progress Notes (Signed)
[facility] [place], [place] [address]  Internal MEDICINE  Office Visit Note  Patient Name: Bonnie Washington  [id number]  [id number]  Date of Service: 09/14/2018  Chief Complaint  Patient presents with  . Medical Management of Chronic Issues  . Letter for School/Work    Sx clearance paperwork,     The patient is here to have repair of meniscus of left knee. Her surgery is scheduled for 09/21/2018. She is here today to have surgical clearance. She has been dealing with this for a few months. Is anxious to have this repaired. She states that she feels good. Denies chest pain, chest pressure, or shortness of breath,       Current Medication: Outpatient Encounter Medications as of 09/14/2018  Medication Sig  . aspirin EC 81 MG tablet Take 81 mg by mouth daily.  . ergocalciferol (DRISDOL) 1.25 MG (50000 UT) capsule Take 1 capsule (50,000 Units total) by mouth once a week.  . fluticasone (FLONASE) 50 MCG/ACT nasal spray INSTILL 1 SPRAY IN EACH NOSTRIL AT BEDTIME (Patient taking differently: as needed. )  . hydrochlorothiazide (MICROZIDE) 12.5 MG capsule TAKE ONE CAPSULE BY MOUTH EVERY DAY FOR BLOOD PRESSURE (Patient taking differently: 12.5 mg. TAKE ONE CAPSULE BY MOUTH EVERY DAY FOR BLOOD PRESSURE)  . meloxicam (MOBIC) 15 MG tablet Take 15 mg by mouth daily.  . meloxicam (MOBIC) 7.5 MG tablet Take 7.5 mg by mouth daily.  Marland Kitchen venlafaxine XR (EFFEXOR-XR) 150 MG 24 hr capsule TAKE 150 MG BY MOUTH DAILY (Patient taking differently: AM TAKE 150 MG BY MOUTH DAILY)  . benzonatate (TESSALON) 200 MG capsule Take 1 capsule (200 mg total) by mouth 2 (two) times daily as needed for cough. (Patient not taking: Reported on 08/31/2018)   No facility-administered encounter medications on file as of 09/14/2018.     Surgical History: Past Surgical History:  Procedure Laterality Date  . CHOLECYSTECTOMY  2014  . FRACTURE SURGERY Right 2004   elbow  . OPEN REDUCTION INTERNAL FIXATION  (ORIF) DISTAL RADIAL FRACTURE Left 10/06/2015   Procedure: OPEN REDUCTION INTERNAL FIXATION (ORIF) DISTAL RADIAL FRACTURE;  Surgeon: Earnestine Leys, MD;  Location: [facility] ORS;  Service: Orthopedics;  Laterality: Left;  Marland Kitchen VENTRAL HERNIA REPAIR N/A 06/10/2017   Procedure: HERNIA REPAIR VENTRAL ADULT;  Surgeon: Robert Bellow, MD;  Location: [facility] ORS;  Service: General;  Laterality: N/A;    Medical History: Past Medical History:  Diagnosis Date  . Depression   . Diabetes mellitus without complication ([facility])    pt use to take metformin but has not taken it in 29yrs/lost weight/ stopped metformin/ watches diet  . Hypertension    controlled on meds    Family History: Family History  Problem Relation Age of Onset  . Hypertension Mother   . Breast cancer Cousin     Social History   Socioeconomic History  . Marital status: Married    Spouse name: Not on file  . Number of children: Not on file  . Years of education: Not on file  . Highest education level: Not on file  Occupational History  . Not on file  Social Needs  . Financial resource strain: Not on file  . Food insecurity    Worry: Not on file    Inability: Not on file  . Transportation needs    Medical: Not on file    Non-medical: Not on file  Tobacco Use  . Smoking status: Former Smoker    Packs/day: 0.50  Years: 20.00    Pack years: 10.00    Types: Cigarettes    Quit date: 03/08/1993    Years since quitting: 25.5  . Smokeless tobacco: Never Used  Substance and Sexual Activity  . Alcohol use: Yes    Alcohol/week: 7.0 standard drinks    Types: 7 Cans of beer per week    Comment: occasionally  . Drug use: No  . Sexual activity: Not on file  Lifestyle  . Physical activity    Days per week: Not on file    Minutes per session: Not on file  . Stress: Not on file  Relationships  . Social Herbalist on phone: Not on file    Gets together: Not on file    Attends religious service: Not on file    Active  member of club or organization: Not on file    Attends meetings of clubs or organizations: Not on file    Relationship status: Not on file  . Intimate partner violence    Fear of current or ex partner: Not on file    Emotionally abused: Not on file    Physically abused: Not on file    Forced sexual activity: Not on file  Other Topics Concern  . Not on file  Social History Narrative  . Not on file      Review of Systems  Constitutional: Negative for chills, fatigue and unexpected weight change.  HENT: Negative for congestion, rhinorrhea, sneezing and sore throat.   Respiratory: Negative for cough, chest tightness, shortness of breath and wheezing.   Cardiovascular: Negative for chest pain and palpitations.  Gastrointestinal: Negative for abdominal pain, constipation, diarrhea, nausea and vomiting.  Endocrine: Negative for cold intolerance, heat intolerance, polydipsia and polyuria.  Genitourinary: Negative for dysuria, frequency, hematuria and urgency.  Musculoskeletal: Positive for arthralgias. Negative for back pain, joint swelling and neck pain.       Left knee pain. Has small tear in medial meniscus. Scheduled to have surgical repair.   Skin: Negative for rash.  Allergic/Immunologic: Negative.   Neurological: Negative for dizziness, tremors, numbness and headaches.  Hematological: Negative for adenopathy. Does not bruise/bleed easily.  Psychiatric/Behavioral: Negative for behavioral problems and sleep disturbance. The patient is not nervous/anxious.     Today's Vitals   09/14/18 0903  BP: (!) 153/77  Pulse: 77  Resp: 16  Temp: (!) 97.5 F (36.4 C)  SpO2: 99%  Weight: 145 lb 3.2 oz (65.9 kg)  Height: 5\' 4"  (1.626 m)   Body mass index is 24.92 kg/m.  Physical Exam Vitals signs and nursing note reviewed.  Constitutional:      General: She is not in acute distress.    Appearance: She is well-developed. She is not diaphoretic.  HENT:     Head: Normocephalic and  atraumatic.     Mouth/Throat:     Pharynx: No oropharyngeal exudate.  Eyes:     Pupils: Pupils are equal, round, and reactive to light.  Neck:     Musculoskeletal: Normal range of motion and neck supple.     Thyroid: No thyromegaly.     Vascular: No JVD.     Trachea: No tracheal deviation.  Cardiovascular:     Rate and Rhythm: Normal rate and regular rhythm.     Pulses: Normal pulses.     Heart sounds: Normal heart sounds. No murmur. No friction rub. No gallop.      Comments: ECG is within normal limits today. Pulmonary:  Effort: Pulmonary effort is normal. No respiratory distress.     Breath sounds: Normal breath sounds. No wheezing or rales.  Chest:     Chest wall: No tenderness.  Abdominal:     Palpations: Abdomen is soft.     Tenderness: There is no abdominal tenderness. There is no guarding.  Genitourinary:    Comments: U/a positive for protein and small WBC Musculoskeletal: Normal range of motion.     Comments: Left knee currently wrapped in supportive brace. Movement causes increased pain. Walking with moderat limp, favoring the left leg.   Lymphadenopathy:     Cervical: No cervical adenopathy.  Skin:    General: Skin is warm and dry.  Neurological:     Mental Status: She is alert and oriented to person, place, and time.     Cranial Nerves: No cranial nerve deficit.  Psychiatric:        Behavior: Behavior normal.        Thought Content: Thought content normal.        Judgment: Judgment normal.    Assessment/Plan:  1. Tear of medial meniscus of left knee, current, unspecified tear type, subsequent encounter Patient scheduled for surgical repair 09/21/2018. She is cleared for surgery with mild risk due to elevated blood pressure.   2. Encounter for preoperative examination for general surgical procedure ECG within normal limits. Labs ordered.  - POCT Urinalysis Dipstick - EKG 12-Lead - CBC, No Differential/Platelet; Future - Basic Metabolic Panel (BMET);  Future - Basic Metabolic Panel (BMET) - CBC, No Differential/Platelet  3. Impaired fasting glucose - POCT HgB A1C  5.8 today. Will continue to monitor.   4. Essential hypertension Generally stable. Continue HCTZ as prescribed   5. Dysuria U/a positive for protein and small WBC. Will send for culture and sensitivity and treat as indicated.   General Counseling: Elika verbalizes understanding of the findings of todays visit and agrees with plan of treatment. I have discussed any further diagnostic evaluation that may be needed or ordered today. We also reviewed her medications today. she has been encouraged to call the office with any questions or concerns that should arise related to todays visit.  This patient was seen by Leretha Pol FNP Collaboration with Dr Lavera Guise as a part of collaborative care agreement  Orders Placed This Encounter  Procedures  . POCT HgB A1C  . POCT Urinalysis Dipstick  . EKG 12-Lead      Time spent: [facility]

## 2018-09-17 LAB — CULTURE, URINE COMPREHENSIVE

## 2018-09-18 ENCOUNTER — Other Ambulatory Visit: Payer: Self-pay

## 2018-09-18 ENCOUNTER — Other Ambulatory Visit
Admission: RE | Admit: 2018-09-18 | Discharge: 2018-09-18 | Disposition: A | Discharge status: Home / Self Care | Payer: PPO | Source: Ambulatory Visit | Attending: Orthopedic Surgery | Admitting: Orthopedic Surgery

## 2018-09-18 DIAGNOSIS — Z01812 Encounter for preprocedural laboratory examination: Secondary | ICD-10-CM | POA: Insufficient documentation

## 2018-09-18 DIAGNOSIS — Z1159 Encounter for screening for other viral diseases: Secondary | ICD-10-CM | POA: Diagnosis not present

## 2018-09-18 DIAGNOSIS — X58XXXA Exposure to other specified factors, initial encounter: Secondary | ICD-10-CM | POA: Insufficient documentation

## 2018-09-18 DIAGNOSIS — Z87891 Personal history of nicotine dependence: Secondary | ICD-10-CM | POA: Diagnosis not present

## 2018-09-18 DIAGNOSIS — I10 Essential (primary) hypertension: Secondary | ICD-10-CM | POA: Insufficient documentation

## 2018-09-18 DIAGNOSIS — S83207A Unspecified tear of unspecified meniscus, current injury, left knee, initial encounter: Secondary | ICD-10-CM | POA: Diagnosis not present

## 2018-09-18 DIAGNOSIS — Z79899 Other long term (current) drug therapy: Secondary | ICD-10-CM | POA: Insufficient documentation

## 2018-09-18 LAB — CBC
HCT: 39.2 % (ref 36.0–46.0)
Hemoglobin: 13.3 g/dL (ref 12.0–15.0)
MCH: 32.5 pg (ref 26.0–34.0)
MCHC: 33.9 g/dL (ref 30.0–36.0)
MCV: 95.8 fL (ref 80.0–100.0)
Platelets: 268 10*3/uL (ref 150–400)
RBC: 4.09 MIL/uL (ref 3.87–5.11)
RDW: 12.1 % (ref 11.5–15.5)
WBC: 4.5 10*3/uL (ref 4.0–10.5)
nRBC: 0 % (ref 0.0–0.2)

## 2018-09-18 LAB — BASIC METABOLIC PANEL
Anion gap: 12 (ref 5–15)
BUN: 9 mg/dL (ref 8–23)
CO2: 24 mmol/L (ref 22–32)
Calcium: 8.7 mg/dL — ABNORMAL LOW (ref 8.9–10.3)
Chloride: 95 mmol/L — ABNORMAL LOW (ref 98–111)
Creatinine, Ser: 0.48 mg/dL (ref 0.44–1.00)
GFR calc Af Amer: >60 mL/min (ref >60)
GFR calc non Af Amer: >60 mL/min (ref >60)
Glucose, Bld: 138 mg/dL — ABNORMAL HIGH (ref 70–99)
Potassium: 3.6 mmol/L (ref 3.5–5.1)
Sodium: 131 mmol/L — ABNORMAL LOW (ref 135–145)

## 2018-09-18 MED ORDER — HYDROCHLOROTHIAZIDE 12.5 MG PO CAPS: ORAL_CAPSULE | ORAL | 1 refills | Status: DC

## 2018-09-19 LAB — SARS CORONAVIRUS 2 (TAT 6-24 HRS): SARS Coronavirus 2: NEGATIVE

## 2018-09-21 ENCOUNTER — Ambulatory Visit
Admission: RE | Admit: 2018-09-21 | Discharge: 2018-09-21 | Disposition: A | Discharge status: Home / Self Care | Payer: PPO | Attending: Orthopedic Surgery | Admitting: Orthopedic Surgery

## 2018-09-21 ENCOUNTER — Ambulatory Visit: Payer: PPO | Admitting: Anesthesiology

## 2018-09-21 ENCOUNTER — Other Ambulatory Visit: Payer: Self-pay

## 2018-09-21 ENCOUNTER — Encounter
Admission: RE | Disposition: A | Discharge status: Home / Self Care | Payer: Self-pay | Source: Home / Self Care | Attending: Orthopedic Surgery

## 2018-09-21 DIAGNOSIS — E119 Type 2 diabetes mellitus without complications: Secondary | ICD-10-CM | POA: Insufficient documentation

## 2018-09-21 DIAGNOSIS — S83282A Other tear of lateral meniscus, current injury, left knee, initial encounter: Secondary | ICD-10-CM | POA: Diagnosis not present

## 2018-09-21 DIAGNOSIS — M94262 Chondromalacia, left knee: Secondary | ICD-10-CM | POA: Insufficient documentation

## 2018-09-21 DIAGNOSIS — X58XXXA Exposure to other specified factors, initial encounter: Secondary | ICD-10-CM | POA: Insufficient documentation

## 2018-09-21 DIAGNOSIS — Z87891 Personal history of nicotine dependence: Secondary | ICD-10-CM | POA: Diagnosis not present

## 2018-09-21 DIAGNOSIS — M23262 Derangement of other lateral meniscus due to old tear or injury, left knee: Secondary | ICD-10-CM | POA: Diagnosis not present

## 2018-09-21 DIAGNOSIS — Z885 Allergy status to narcotic agent status: Secondary | ICD-10-CM | POA: Insufficient documentation

## 2018-09-21 DIAGNOSIS — S83242A Other tear of medial meniscus, current injury, left knee, initial encounter: Secondary | ICD-10-CM | POA: Insufficient documentation

## 2018-09-21 DIAGNOSIS — M65862 Other synovitis and tenosynovitis, left lower leg: Secondary | ICD-10-CM | POA: Diagnosis not present

## 2018-09-21 DIAGNOSIS — Z79899 Other long term (current) drug therapy: Secondary | ICD-10-CM | POA: Insufficient documentation

## 2018-09-21 DIAGNOSIS — I10 Essential (primary) hypertension: Secondary | ICD-10-CM | POA: Diagnosis not present

## 2018-09-21 DIAGNOSIS — M659 Synovitis and tenosynovitis, unspecified: Secondary | ICD-10-CM | POA: Diagnosis not present

## 2018-09-21 HISTORY — PX: KNEE ARTHROSCOPY WITH MEDIAL MENISECTOMY: SHX5651

## 2018-09-21 SURGERY — ARTHROSCOPY, KNEE, WITH MEDIAL MENISCECTOMY
Anesthesia: General | Site: Knee | Laterality: Left

## 2018-09-21 MED ORDER — ONDANSETRON HCL 4 MG/2ML IJ SOLN
INTRAMUSCULAR | Status: DC | PRN
  Administered 2018-09-21: 4 mg via INTRAVENOUS

## 2018-09-21 MED ORDER — BUPIVACAINE-EPINEPHRINE (PF) 0.25% -1:200000 IJ SOLN
INTRAMUSCULAR | Status: DC | PRN
  Administered 2018-09-21: 5 mL via PERINEURAL

## 2018-09-21 MED ORDER — LACTATED RINGERS IV SOLN: INTRAVENOUS | Status: DC

## 2018-09-21 MED ORDER — LACTATED RINGERS IV SOLN
INTRAVENOUS | Status: DC
  Administered 2018-09-21: 12:00:00 via INTRAVENOUS

## 2018-09-21 MED ORDER — DOCUSATE SODIUM 100 MG PO CAPS: 100.0000 mg | ORAL_CAPSULE | Freq: Every day | ORAL | 2 refills | Status: AC | PRN

## 2018-09-21 MED ORDER — LIDOCAINE HCL (CARDIAC) PF 100 MG/5ML IV SOSY
PREFILLED_SYRINGE | INTRAVENOUS | Status: DC | PRN
  Administered 2018-09-21: 40 mg via INTRATRACHEAL

## 2018-09-21 MED ORDER — GLYCOPYRROLATE 0.2 MG/ML IJ SOLN
INTRAMUSCULAR | Status: DC | PRN
  Administered 2018-09-21: 0.1 mg via INTRAVENOUS

## 2018-09-21 MED ORDER — MIDAZOLAM HCL 5 MG/5ML IJ SOLN
INTRAMUSCULAR | Status: DC | PRN
  Administered 2018-09-21: 2 mg via INTRAVENOUS

## 2018-09-21 MED ORDER — ONDANSETRON 4 MG PO TBDP: 4.0000 mg | ORAL_TABLET | Freq: Once | ORAL | Status: DC

## 2018-09-21 MED ORDER — FENTANYL CITRATE (PF) 100 MCG/2ML IJ SOLN
25.0000 ug | INTRAMUSCULAR | Status: DC | PRN
  Administered 2018-09-21: 25 ug via INTRAVENOUS

## 2018-09-21 MED ORDER — OXYCODONE HCL 5 MG/5ML PO SOLN: 5.0000 mg | Freq: Once | ORAL | Status: AC | PRN

## 2018-09-21 MED ORDER — FENTANYL CITRATE (PF) 100 MCG/2ML IJ SOLN
INTRAMUSCULAR | Status: DC | PRN
  Administered 2018-09-21: 50 ug via INTRAVENOUS
  Administered 2018-09-21: 25 ug via INTRAVENOUS

## 2018-09-21 MED ORDER — OXYCODONE HCL 5 MG PO TABS
5.0000 mg | ORAL_TABLET | Freq: Once | ORAL | Status: AC | PRN
  Administered 2018-09-21: 5 mg via ORAL

## 2018-09-21 MED ORDER — CELECOXIB 400 MG PO CAPS
400.0000 mg | ORAL_CAPSULE | Freq: Once | ORAL | Status: AC
  Administered 2018-09-21: 400 mg via ORAL

## 2018-09-21 MED ORDER — HYDROCODONE-ACETAMINOPHEN 5-325 MG PO TABS: 1.0000 | ORAL_TABLET | ORAL | 0 refills | Status: AC | PRN

## 2018-09-21 MED ORDER — CHLORHEXIDINE GLUCONATE 4 % EX LIQD: 60.0000 mL | Freq: Once | CUTANEOUS | Status: DC

## 2018-09-21 MED ORDER — LACTATED RINGERS IV SOLN
INTRAVENOUS | Status: DC | PRN
  Administered 2018-09-21: 6000 mL

## 2018-09-21 MED ORDER — DEXAMETHASONE SODIUM PHOSPHATE 4 MG/ML IJ SOLN
INTRAMUSCULAR | Status: DC | PRN
  Administered 2018-09-21: 4 mg via INTRAVENOUS

## 2018-09-21 MED ORDER — ONDANSETRON HCL 4 MG/2ML IJ SOLN: 4.0000 mg | Freq: Once | INTRAMUSCULAR | Status: DC | PRN

## 2018-09-21 MED ORDER — ACETAMINOPHEN 500 MG PO TABS
1000.0000 mg | ORAL_TABLET | Freq: Once | ORAL | Status: AC
  Administered 2018-09-21: 975 mg via ORAL

## 2018-09-21 MED ORDER — ROPIVACAINE HCL 5 MG/ML IJ SOLN
INTRAMUSCULAR | Status: DC | PRN
  Administered 2018-09-21: 9 mL

## 2018-09-21 MED ORDER — PROPOFOL 10 MG/ML IV BOLUS
INTRAVENOUS | Status: DC | PRN
  Administered 2018-09-21: 10 mg via INTRAVENOUS

## 2018-09-21 MED ORDER — TRIAMCINOLONE ACETONIDE 40 MG/ML IJ SUSP
INTRAMUSCULAR | Status: DC | PRN
  Administered 2018-09-21: 40 mg via INTRA_ARTICULAR

## 2018-09-21 MED ORDER — CEFAZOLIN SODIUM-DEXTROSE 2-4 GM/100ML-% IV SOLN
2.0000 g | INTRAVENOUS | Status: AC
  Administered 2018-09-21: 2 g via INTRAVENOUS

## 2018-09-21 SURGICAL SUPPLY — 33 items
ADAPTER IRRIG TUBE 2 SPIKE SOL (ADAPTER) ×4 IMPLANT
BLADE SHAVER 4.5 DBL SERAT CV (CUTTER) ×4 IMPLANT
BLADE SURG SZ11 CARB STEEL (BLADE) ×2 IMPLANT
BRUSH SCRUB EZ  4% CHG (MISCELLANEOUS) ×2
BRUSH SCRUB EZ 4% CHG (MISCELLANEOUS) ×2 IMPLANT
CHLORAPREP W/TINT 26 (MISCELLANEOUS) ×2 IMPLANT
COOLER POLAR GLACIER W/PUMP (MISCELLANEOUS) ×2 IMPLANT
DRAPE IMP U-DRAPE 54X76 (DRAPES) ×2 IMPLANT
DYONICS ARTHOSCOPIC SURGERY BLADE ×2 IMPLANT
GAUZE SPONGE 4X4 12PLY STRL (GAUZE/BANDAGES/DRESSINGS) ×2 IMPLANT
GAUZE XEROFORM 1X8 LF (GAUZE/BANDAGES/DRESSINGS) ×2 IMPLANT
GLOVE INDICATOR 8.0 STRL GRN (GLOVE) ×2 IMPLANT
GLOVE SURG ORTHO 8.0 STRL STRW (GLOVE) ×2 IMPLANT
GOWN STRL REUS W/ TWL LRG LVL3 (GOWN DISPOSABLE) ×1 IMPLANT
GOWN STRL REUS W/ TWL XL LVL3 (GOWN DISPOSABLE) ×1 IMPLANT
GOWN STRL REUS W/TWL LRG LVL3 (GOWN DISPOSABLE) ×1
GOWN STRL REUS W/TWL XL LVL3 (GOWN DISPOSABLE) ×1
IV LACTATED RINGER IRRG 3000ML (IV SOLUTION) ×4
IV LR IRRIG 3000ML ARTHROMATIC (IV SOLUTION) ×4 IMPLANT
KIT TURNOVER KIT A (KITS) ×2 IMPLANT
MANIFOLD NEPTUNE II (INSTRUMENTS) ×2 IMPLANT
MAT ABSORB  FLUID 56X50 GRAY (MISCELLANEOUS) ×1
MAT ABSORB FLUID 56X50 GRAY (MISCELLANEOUS) ×1 IMPLANT
NEEDLE SPNL 20GX3.5 QUINCKE YW (NEEDLE) ×2 IMPLANT
PACK ARTHROSCOPY KNEE (MISCELLANEOUS) ×2 IMPLANT
PAD ABD DERMACEA PRESS 5X9 (GAUZE/BANDAGES/DRESSINGS) ×4 IMPLANT
PAD WRAPON POLAR KNEE (MISCELLANEOUS) ×1 IMPLANT
SUT ETHILON 4-0 (SUTURE) ×1
SUT ETHILON 4-0 FS2 18XMFL BLK (SUTURE) ×1
SUTURE ETHLN 4-0 FS2 18XMF BLK (SUTURE) ×1 IMPLANT
TUBING ARTHRO INFLOW-ONLY STRL (TUBING) ×2 IMPLANT
WAND WEREWOLF FLOW 90D (MISCELLANEOUS) ×2 IMPLANT
WRAPON POLAR PAD KNEE (MISCELLANEOUS) ×2

## 2018-09-21 NOTE — Anesthesia Preprocedure Evaluation (Addendum)
Anesthesia Evaluation  Patient identified by MRN, date of birth, ID band Patient awake    Reviewed: Allergy & Precautions, H&P , NPO status , Patient's Chart, lab work & pertinent test results  History of Anesthesia Complications Negative for: history of anesthetic complications  Airway Mallampati: II  TM Distance: >3 FB Neck ROM: full    Dental no notable dental hx.    Pulmonary former smoker,    Pulmonary exam normal breath sounds clear to auscultation       Cardiovascular hypertension, On Medications Normal cardiovascular exam Rhythm:regular Rate:Normal     Neuro/Psych    GI/Hepatic negative GI ROS, Neg liver ROS,   Endo/Other  diabetes, Well Controlled  Renal/GU      Musculoskeletal   Abdominal   Peds  Hematology negative hematology ROS (+)   Anesthesia Other Findings   Reproductive/Obstetrics negative OB ROS                            Anesthesia Physical Anesthesia Plan  ASA: II  Anesthesia Plan: General LMA   Post-op Pain Management:    Induction:   PONV Risk Score and Plan:   Airway Management Planned:   Additional Equipment:   Intra-op Plan:   Post-operative Plan:   Informed Consent: I have reviewed the patients History and Physical, chart, labs and discussed the procedure including the risks, benefits and alternatives for the proposed anesthesia with the patient or authorized representative who has indicated his/her understanding and acceptance.       Plan Discussed with:   Anesthesia Plan Comments:         Anesthesia Quick Evaluation

## 2018-09-21 NOTE — Discharge Instructions (Signed)
Post Op Home Instructions for Knee Arthroscopy ° °1) Do not sit for longer than 1 hour at a time with your leg dangling down.  You should have your legs elevated (higher than your heart) in a recliner chair or couch. ° °2) You may be up walking around as tolerated but should take periodic breaks to elevate your legs.  Discontinue use of crutches when you feel you are able to walk without pain or a limp. ° °3) Work on gentle bending and straightening of the knee. ° °4) You may remove the Ace wrap and dressings two days after surgery.  Place band aids over the incision sites. ° °5) You may shower after you remove the surgical dressing.  You do not need to cover the incision with plastic wrap.  The incision can get wet, but do not submerge under water.  After your sutures have been removed, you should wait 24 hours before submerging incision under water. ° °6) Pain medication can cause constipation.  You should increase your fluid intake, increase your intake of high fiber foods and/or take Metamucil as needed for constipation. ° °7) Continue your physical therapy exercises, as shown at the office, at least twice daily.  You should set up outpatient physical therapy and start within the first week after surgery. ° °8) Continue to use your Polar Pack continuously for 2-3 days after surgery.  After you remove the surgical dressing, it is a good idea to use your Polar Pack or ice pack for 30 minutes after doing your exercises to reduce swelling. ° °9) Do not be surprised if you have increased pain at night.  This usually means you have been a little too active during the day and need to reduce your activities. ° °10) If you develop lower extremity swelling that does not improve after a night of elevation, please call the office.  This could be an early sign of a blood clot. ° °Please call with any questions at [phone] ° ° °General Anesthesia, Adult, Care After °This sheet gives you information about how to care for  yourself after your procedure. Your health care provider may also give you more specific instructions. If you have problems or questions, contact your health care provider. °What can I expect after the procedure? °After the procedure, the following side effects are common: °· Pain or discomfort at the IV site. °· Nausea. °· Vomiting. °· Sore throat. °· Trouble concentrating. °· Feeling cold or chills. °· Weak or tired. °· Sleepiness and fatigue. °· Soreness and body aches. These side effects can affect parts of the body that were not involved in surgery. °Follow these instructions at home: ° °For at least 24 hours after the procedure: °· Have a responsible adult stay with you. It is important to have someone help care for you until you are awake and alert. °· Rest as needed. °· Do not: °? Participate in activities in which you could fall or become injured. °? Drive. °? Use heavy machinery. °? Drink alcohol. °? Take sleeping pills or medicines that cause drowsiness. °? Make important decisions or sign legal documents. °? Take care of children on your own. °Eating and drinking °· Follow any instructions from your health care provider about eating or drinking restrictions. °· When you feel hungry, start by eating small amounts of foods that are soft and easy to digest (bland), such as toast. Gradually return to your regular diet. °· Drink enough fluid to keep your urine pale yellow. °·   If you vomit, rehydrate by drinking water, juice, or clear broth. °General instructions °· If you have sleep apnea, surgery and certain medicines can increase your risk for breathing problems. Follow instructions from your health care provider about wearing your sleep device: °? Anytime you are sleeping, including during daytime naps. °? While taking prescription pain medicines, sleeping medicines, or medicines that make you drowsy. °· Return to your normal activities as told by your health care provider. Ask your health care provider  what activities are safe for you. °· Take over-the-counter and prescription medicines only as told by your health care provider. °· If you smoke, do not smoke without supervision. °· Keep all follow-up visits as told by your health care provider. This is important. °Contact a health care provider if: °· You have nausea or vomiting that does not get better with medicine. °· You cannot eat or drink without vomiting. °· You have pain that does not get better with medicine. °· You are unable to pass urine. °· You develop a skin rash. °· You have a fever. °· You have redness around your IV site that gets worse. °Get help right away if: °· You have difficulty breathing. °· You have chest pain. °· You have blood in your urine or stool, or you vomit blood. °Summary °· After the procedure, it is common to have a sore throat or nausea. It is also common to feel tired. °· Have a responsible adult stay with you for the first 24 hours after general anesthesia. It is important to have someone help care for you until you are awake and alert. °· When you feel hungry, start by eating small amounts of foods that are soft and easy to digest (bland), such as toast. Gradually return to your regular diet. °· Drink enough fluid to keep your urine pale yellow. °· Return to your normal activities as told by your health care provider. Ask your health care provider what activities are safe for you. °This information is not intended to replace advice given to you by your health care provider. Make sure you discuss any questions you have with your health care provider. °Document Released: 05/31/2000 Document Revised: 02/25/2017 Document Reviewed: 10/08/2016 °Elsevier Patient Education © 2020 [employer] ° °

## 2018-09-21 NOTE — Anesthesia Procedure Notes (Signed)
Procedure Name: LMA Insertion Date/Time: 09/21/2018 1:08 PM Performed by: Mayme Genta, CRNA Pre-anesthesia Checklist: Patient identified, Emergency Drugs available, Suction available, Timeout performed and Patient being monitored Patient Re-evaluated:Patient Re-evaluated prior to induction Oxygen Delivery Method: Circle system utilized Preoxygenation: Pre-oxygenation with 100% oxygen Induction Type: IV induction LMA: LMA inserted LMA Size: 4.0 Number of attempts: 1 Placement Confirmation: positive ETCO2 and breath sounds checked- equal and bilateral Tube secured with: Tape

## 2018-09-21 NOTE — Anesthesia Postprocedure Evaluation (Signed)
Anesthesia Post Note  Patient: Bonnie Washington  Procedure(s) Performed: KNEE ARTHROSCOPY WITH MEDIAL AND LATERAL MENISECTOMY, SYNOVECTOMY, CHONDROPLASTY (Left Knee)  Patient location during evaluation: PACU Anesthesia Type: General Level of consciousness: awake and alert Pain management: pain level controlled Vital Signs Assessment: post-procedure vital signs reviewed and stable Respiratory status: spontaneous breathing Cardiovascular status: stable and blood pressure returned to baseline Anesthetic complications: no    Jaci Standard, III,  Ayda Tancredi D

## 2018-09-21 NOTE — Op Note (Signed)
  PATIENT:  Bonnie Washington  PRE-OPERATIVE DIAGNOSIS:  TEAR OF MEDIAL MENISCUS, LEFT KNEE  POST-OPERATIVE DIAGNOSIS:  Tear of the root and body of the medial meniscus, degenerative tearing of the lateral meniscus, Grade 2 chondromalacia the medial compartment, synovitic synovitis of all 3 compartments  PROCEDURE:  KNEE ARTHROSCOPY WITH  Partial MEDIAL AND LATERAL MENISECTOMY, partial synovectomy, and chondroplasty  SURGEON:  Kurtis Bushman, MD  ANESTHESIA:   General  PREOPERATIVE INDICATIONS:  Bonnie Washington  73 y.o. female with a diagnosis of TEAR OF MEDIAL MENISCUS, left knee who failed conservative management and elected for surgical management.    The risks benefits and alternatives were discussed with the patient preoperatively including the risks of infection, bleeding, nerve injury, knee stiffness, persistent pain, osteoarthritis and the need for further surgery. Medical  risks include DVT and pulmonary embolism, myocardial infarction, stroke, pneumonia, respiratory failure and death. The patient understood these risks and wished to proceed.   OPERATIVE FINDINGS: Tear of the root of the medial meniscus, degenerative tearing of the lateral meniscus, Grade 2 chondromalacia of the medial compartment, synovitic synovitis of all 3 compartments. ACL and PCL were intact. There was mild lateral subluxation of the patella. No loose bodies were identified within the medial or lateral gutters.  OPERATIVE PROCEDURE: Patient was met in the preoperative area. The operative extremity was signed with my initials according the hospital's correct site of surgery protocol.  The patient was brought to the operating room where they was placed supine on the operative table. General anesthesia was administered. The patient was prepped and draped in a sterile fashion.  A timeout was performed to verify the patient's name, date of birth, medical record number, correct site of surgery correct procedure to be  performed. It was also used to verify the patient received antibiotics that all appropriate instruments, and radiographic studies were available in the room. Once all in attendance were in agreement, the case began.  Proposed arthroscopy incisions were drawn out with a surgical marker. These were pre-injected with 0.5% marcaine with epinephrine. An 11 blade was used to establish an inferior lateral and inferomedial portals. The inferomedial portal was created using a 18-gauge spinal needle under direct visualization.  A full diagnostic examination of the knee was performed including the suprapatellar pouch, patellofemoral joint, medial lateral compartments as well as the medial lateral gutters, the intercondylar notch in the posterior knee.  Patient had the medial and lateral meniscal tear treated with a 4-0 resector shaver blade and straight duckbill basket. The meniscus was debrided until a stable rim was achieved. A chondroplasty of the medial femoral condyle, trochlea and undersurface of patella was also performed using a 4-0 resector shaver blade. A partial synovectomy was also performed using a 4-0 resector shaver blade and electrocautery.  The knee was then copiously lavaged. All arthroscopic instruments were removed. The 2 arthroscopy portals were closed with 4-0 nylon. A dry sterile and compressive dressing was applied. The patient was brought to the PACU in stable condition. I was scrubbed and present for the entire case and all sharp and instrument counts were correct at the conclusion the case. I spoke with the patient's family postoperatively to let them know the case was performed without complication and the patient was stable in the recovery room.  Kurtis Bushman, MD

## 2018-09-21 NOTE — Transfer of Care (Signed)
Immediate Anesthesia Transfer of Care Note  Patient: Bonnie Washington  Procedure(s) Performed: KNEE ARTHROSCOPY WITH MEDIAL AND LATERAL MENISECTOMY, SYNOVECTOMY, CHONDROPLASTY (Left Knee)  Patient Location: PACU  Anesthesia Type: General LMA  Level of Consciousness: awake, alert  and patient cooperative  Airway and Oxygen Therapy: Patient Spontanous Breathing and Patient connected to supplemental oxygen  Post-op Assessment: Post-op Vital signs reviewed, Patient's Cardiovascular Status Stable, Respiratory Function Stable, Patent Airway and No signs of Nausea or vomiting  Post-op Vital Signs: Reviewed and stable  Complications: No apparent anesthesia complications

## 2018-09-21 NOTE — H&P (Signed)
The patient has been re-examined, and the chart reviewed, and there have been no interval changes to the documented history and physical.  Plan a left knee scope today.  Anesthesia is not consulted regarding a peripheral nerve block for post-operative pain.  The risks, benefits, and alternatives have been discussed at length, and the patient is willing to proceed.     

## 2018-09-22 ENCOUNTER — Encounter: Payer: Self-pay | Admitting: Orthopedic Surgery

## 2018-09-27 DIAGNOSIS — M25562 Pain in left knee: Secondary | ICD-10-CM | POA: Diagnosis not present

## 2018-09-27 DIAGNOSIS — Z9889 Other specified postprocedural states: Secondary | ICD-10-CM | POA: Diagnosis not present

## 2018-10-04 DIAGNOSIS — M25562 Pain in left knee: Secondary | ICD-10-CM | POA: Diagnosis not present

## 2018-10-11 DIAGNOSIS — M25562 Pain in left knee: Secondary | ICD-10-CM | POA: Diagnosis not present

## 2018-10-13 DIAGNOSIS — M25562 Pain in left knee: Secondary | ICD-10-CM | POA: Diagnosis not present

## 2018-10-16 ENCOUNTER — Ambulatory Visit: Payer: Self-pay | Admitting: Nurse Practitioner

## 2018-10-30 ENCOUNTER — Other Ambulatory Visit: Payer: Self-pay

## 2018-10-30 DIAGNOSIS — E559 Vitamin D deficiency, unspecified: Secondary | ICD-10-CM

## 2018-10-30 MED ORDER — ERGOCALCIFEROL 1.25 MG (50000 UT) PO CAPS: 50000.0000 [IU] | ORAL_CAPSULE | ORAL | 2 refills | Status: DC

## 2018-11-01 ENCOUNTER — Other Ambulatory Visit: Payer: Self-pay

## 2018-11-01 MED ORDER — VENLAFAXINE HCL ER 150 MG PO CP24: ORAL_CAPSULE | ORAL | 1 refills | Status: DC

## 2018-12-18 ENCOUNTER — Ambulatory Visit: Payer: Self-pay | Admitting: Nurse Practitioner

## 2019-01-02 DIAGNOSIS — Z9889 Other specified postprocedural states: Secondary | ICD-10-CM | POA: Diagnosis not present

## 2019-01-02 DIAGNOSIS — M1712 Unilateral primary osteoarthritis, left knee: Secondary | ICD-10-CM | POA: Diagnosis not present

## 2019-01-10 ENCOUNTER — Ambulatory Visit: Payer: Self-pay | Admitting: Nurse Practitioner

## 2019-02-09 ENCOUNTER — Telehealth: Payer: Self-pay

## 2019-02-09 NOTE — Telephone Encounter (Signed)
LMOM TO CONFIRM 02-13-19 OV AS VIRTUAL.

## 2019-02-13 ENCOUNTER — Ambulatory Visit (INDEPENDENT_AMBULATORY_CARE_PROVIDER_SITE_OTHER): Payer: PPO | Admitting: Nurse Practitioner

## 2019-02-13 ENCOUNTER — Encounter: Payer: Self-pay | Admitting: Nurse Practitioner

## 2019-02-13 ENCOUNTER — Other Ambulatory Visit: Payer: Self-pay

## 2019-02-13 DIAGNOSIS — Z23 Encounter for immunization: Secondary | ICD-10-CM | POA: Diagnosis not present

## 2019-02-13 DIAGNOSIS — E559 Vitamin D deficiency, unspecified: Secondary | ICD-10-CM | POA: Diagnosis not present

## 2019-02-13 DIAGNOSIS — I10 Essential (primary) hypertension: Secondary | ICD-10-CM

## 2019-02-13 DIAGNOSIS — Z0001 Encounter for general adult medical examination with abnormal findings: Secondary | ICD-10-CM | POA: Diagnosis not present

## 2019-02-13 MED ORDER — PNEUMOCOCCAL 13-VAL CONJ VACC IM SUSP: 0.5000 mL | Freq: Once | INTRAMUSCULAR | 0 refills | Status: AC

## 2019-02-13 NOTE — Progress Notes (Signed)
[facility] [place], [place] [address]  Internal MEDICINE  Telephone Visit  Patient Name: Bonnie Washington  [id number]  [id number]  Date of Service: 02/13/2019  I connected with the patient at 3:25pm by webcam and verified the patients identity using two identifiers.   I discussed the limitations, risks, security and privacy concerns of performing an evaluation and management service by webcam and the availability of in person appointments. I also discussed with the patient that there may be a patient responsible charge related to the service.  The patient expressed understanding and agrees to proceed.    Chief Complaint  Patient presents with  . Telephone Assessment  . Telephone Screen  . Annual Exam  . Sinusitis  . Quality Metric Gaps    pneumnovax    The patient has been contacted via webcam for follow up visit due to concerns for spread of novel coronavirus. The patient presents for health maintenance exam. She states that she feels some sinus congestion off and on, but this is intermittent. She has no other concerns or complaints. She is due to have routine fasting labs done. Would like to wait until later next year to have her screening mammogram. She does need to have Prevnar 13 vaccineation.       Current Medication: Outpatient Encounter Medications as of 02/13/2019  Medication Sig  . aspirin EC 81 MG tablet Take 81 mg by mouth daily.  . benzonatate (TESSALON) 200 MG capsule Take 1 capsule (200 mg total) by mouth 2 (two) times daily as needed for cough.  . docusate sodium (COLACE) 100 MG capsule Take 1 capsule (100 mg total) by mouth daily as needed.  . ergocalciferol (DRISDOL) 1.25 MG (50000 UT) capsule Take 1 capsule (50,000 Units total) by mouth once a week.  . fluticasone (FLONASE) 50 MCG/ACT nasal spray INSTILL 1 SPRAY IN EACH NOSTRIL AT BEDTIME (Patient taking differently: as needed. )  . hydrochlorothiazide (MICROZIDE) 12.5 MG capsule TAKE ONE  CAPSULE BY MOUTH EVERY DAY FOR BLOOD PRESSURE  . HYDROcodone-acetaminophen (NORCO/VICODIN) 5-325 MG tablet Take 1 tablet by mouth every 4 (four) hours as needed for moderate pain.  . meloxicam (MOBIC) 15 MG tablet Take 15 mg by mouth daily.  Marland Kitchen venlafaxine XR (EFFEXOR-XR) 150 MG 24 hr capsule TAKE 150 MG BY MOUTH DAILY  . pneumococcal 13-valent conjugate vaccine (PREVNAR 13) SUSP injection Inject 0.5 mLs into the muscle once for 1 dose.   No facility-administered encounter medications on file as of 02/13/2019.     Surgical History: Past Surgical History:  Procedure Laterality Date  . CHOLECYSTECTOMY  2014  . FRACTURE SURGERY Right 2004   elbow  . KNEE ARTHROSCOPY WITH MEDIAL MENISECTOMY Left 09/21/2018   Procedure: KNEE ARTHROSCOPY WITH MEDIAL AND LATERAL MENISECTOMY, SYNOVECTOMY, CHONDROPLASTY;  Surgeon: Lovell Sheehan, MD;  Location: [place];  Service: Orthopedics;  Laterality: Left;  Diabetes-diet controlled  . OPEN REDUCTION INTERNAL FIXATION (ORIF) DISTAL RADIAL FRACTURE Left 10/06/2015   Procedure: OPEN REDUCTION INTERNAL FIXATION (ORIF) DISTAL RADIAL FRACTURE;  Surgeon: Earnestine Leys, MD;  Location: [facility];  Service: Orthopedics;  Laterality: Left;  Marland Kitchen VENTRAL HERNIA REPAIR N/A 06/10/2017   Procedure: HERNIA REPAIR VENTRAL ADULT;  Surgeon: Robert Bellow, MD;  Location: [facility];  Service: General;  Laterality: N/A;    Medical History: Past Medical History:  Diagnosis Date  . Depression   . Diabetes mellitus without complication ([place])    pt use to take metformin but has not taken  it in 73yrs/lost weight/ stopped metformin/ watches diet  . Hypertension    controlled on meds    Family History: Family History  Problem Relation Age of Onset  . Hypertension Mother   . Breast cancer Cousin     Social History   Socioeconomic History  . Marital status: Married    Spouse name: Not on file  . Number of children: Not on file  . Years of education: Not on file  .  Highest education level: Not on file  Occupational History  . Not on file  Social Needs  . Financial resource strain: Not on file  . Food insecurity    Worry: Not on file    Inability: Not on file  . Transportation needs    Medical: Not on file    Non-medical: Not on file  Tobacco Use  . Smoking status: Former Smoker    Packs/day: 0.50    Years: 20.00    Pack years: 10.00    Types: Cigarettes    Quit date: 03/08/1993    Years since quitting: 25.9  . Smokeless tobacco: Never Used  Substance and Sexual Activity  . Alcohol use: Yes    Alcohol/week: 7.0 standard drinks    Types: 7 Cans of beer per week    Comment: occasionally  . Drug use: No  . Sexual activity: Not on file  Lifestyle  . Physical activity    Days per week: Not on file    Minutes per session: Not on file  . Stress: Not on file  Relationships  . Social Herbalist on phone: Not on file    Gets together: Not on file    Attends religious service: Not on file    Active member of club or organization: Not on file    Attends meetings of clubs or organizations: Not on file    Relationship status: Not on file  . Intimate partner violence    Fear of current or ex partner: Not on file    Emotionally abused: Not on file    Physically abused: Not on file    Forced sexual activity: Not on file  Other Topics Concern  . Not on file  Social History Narrative  . Not on file      Review of Systems  Constitutional: Negative for activity change, chills, fatigue and unexpected weight change.  HENT: Positive for congestion and postnasal drip. Negative for rhinorrhea, sneezing and sore throat.        Intermittent sinus issues.   Respiratory: Negative for cough, chest tightness, shortness of breath and wheezing.   Cardiovascular: Negative for chest pain and palpitations.  Gastrointestinal: Negative for abdominal pain, constipation, diarrhea, nausea and vomiting.  Endocrine: Negative for cold intolerance, heat  intolerance, polydipsia and polyuria.  Genitourinary: Negative for dysuria and frequency.  Musculoskeletal: Negative for arthralgias, back pain, joint swelling and neck pain.  Skin: Negative for rash.  Neurological: Negative for dizziness, tremors, numbness and headaches.  Hematological: Negative for adenopathy. Does not bruise/bleed easily.  Psychiatric/Behavioral: Positive for dysphoric mood. Negative for behavioral problems (Depression), sleep disturbance and suicidal ideas. The patient is not nervous/anxious.        Well controlled on current medication    Vital Signs: There were no vitals taken for this visit.   Observation/Objective:   The patient is alert and oriented. She is pleasant and answers all questions appropriately. Breathing is non-labored. She is in no acute distress at this time.  Depression screen [facility] 2/9 02/13/2019 09/14/2018 04/17/2018 03/23/2018 03/16/2018  Decreased Interest 0 0 0 0 0  Down, Depressed, Hopeless 0 0 0 0 0  PHQ - 2 Score 0 0 0 0 0    Functional Status Survey: Is the patient deaf or have difficulty hearing?: No Does the patient have difficulty seeing, even when wearing glasses/contacts?: No Does the patient have difficulty concentrating, remembering, or making decisions?: No Does the patient have difficulty walking or climbing stairs?: No Does the patient have difficulty dressing or bathing?: No Does the patient have difficulty doing errands alone such as visiting a doctor's office or shopping?: No  MMSE - [place] Exam 02/13/2019 10/13/2017  Orientation to time 5 5  Orientation to Place 5 5  Registration 3 3  Attention/ Calculation 5 5  Recall 3 3  Language- name 2 objects 2 2  Language- repeat 1 1  Language- follow 3 step command 3 3  Language- read & follow direction 1 1  Write a sentence 1 1  Copy design 1 1  Total score 30 30    Fall Risk  02/13/2019 09/14/2018 04/17/2018 03/23/2018 03/16/2018  Falls in the past year? 0 0 0 0 0   Number falls in past yr: - - 0 - 0  Injury with Fall? - - 0 - 0    Assessment/Plan:  1. Encounter for general adult medical examination with abnormal findings Annual health maintenance exam today.   2. Essential hypertension Stable. Continue HCTZ as prescribed   3. Vitamin D deficiency Check vitamin d level and treat as indicated   4. Need for vaccination against Streptococcus pneumoniae using pneumococcal conjugate vaccine 13 Prescription for Prevnar 13 sent to her pharmacy for administration.  - pneumococcal 13-valent conjugate vaccine (PREVNAR 13) SUSP injection; Inject 0.5 mLs into the muscle once for 1 dose.  Dispense: 0.5 mL; Refill: 0  General Counseling: Colleene verbalizes understanding of the findings of today's phone visit and agrees with plan of treatment. I have discussed any further diagnostic evaluation that may be needed or ordered today. We also reviewed her medications today. she has been encouraged to call the office with any questions or concerns that should arise related to todays visit.   This patient was seen by Luis Llorens Torres with Dr Lavera Guise as a part of collaborative care agreement  Meds ordered this encounter  Medications  . pneumococcal 13-valent conjugate vaccine (PREVNAR 13) SUSP injection    Sig: Inject 0.5 mLs into the muscle once for 1 dose.    Dispense:  0.5 mL    Refill:  0    Order Specific Question:   Supervising Provider    Answer:   Lavera Guise [id number]    Time spent: 3 Minutes    Dr Lavera Guise Internal medicine

## 2019-02-27 ENCOUNTER — Other Ambulatory Visit: Payer: Self-pay

## 2019-02-27 MED ORDER — FLUTICASONE PROPIONATE 50 MCG/ACT NA SUSP: NASAL | 11 refills | Status: DC

## 2019-03-26 DIAGNOSIS — M1712 Unilateral primary osteoarthritis, left knee: Secondary | ICD-10-CM | POA: Diagnosis not present

## 2019-03-26 DIAGNOSIS — Z9889 Other specified postprocedural states: Secondary | ICD-10-CM | POA: Diagnosis not present

## 2019-04-30 ENCOUNTER — Other Ambulatory Visit: Payer: Self-pay

## 2019-04-30 MED ORDER — VENLAFAXINE HCL ER 150 MG PO CP24: ORAL_CAPSULE | ORAL | 0 refills | Status: DC

## 2019-06-12 ENCOUNTER — Telehealth: Payer: Self-pay

## 2019-06-12 NOTE — Telephone Encounter (Signed)
Called lmom/lmom informing patient of appointment on 06/14/2019. klh

## 2019-06-14 ENCOUNTER — Ambulatory Visit: Payer: PPO | Admitting: Adult Health

## 2019-06-18 ENCOUNTER — Telehealth: Payer: Self-pay

## 2019-06-18 NOTE — Telephone Encounter (Signed)
Called lmom informing patient of appointment on 06/20/2019. klh

## 2019-06-20 ENCOUNTER — Ambulatory Visit: Payer: PPO | Admitting: Adult Health

## 2019-06-22 ENCOUNTER — Telehealth: Payer: Self-pay

## 2019-06-22 NOTE — Telephone Encounter (Signed)
Called lmom/lmom informing patient of appointment on 04/202/2021. klh

## 2019-06-26 ENCOUNTER — Ambulatory Visit: Payer: PPO | Admitting: Adult Health

## 2019-07-06 ENCOUNTER — Telehealth: Payer: Self-pay

## 2019-07-06 NOTE — Telephone Encounter (Signed)
Confirmed appointment on 07/10/2019 and screened for covid. klh

## 2019-07-10 ENCOUNTER — Other Ambulatory Visit: Payer: Self-pay

## 2019-07-10 ENCOUNTER — Ambulatory Visit (INDEPENDENT_AMBULATORY_CARE_PROVIDER_SITE_OTHER): Payer: PPO | Admitting: Adult Health

## 2019-07-10 ENCOUNTER — Encounter: Payer: Self-pay | Admitting: Adult Health

## 2019-07-10 VITALS — BP 140/80 | HR 73 | Temp 97.4°F | Resp 16 | Ht 64.0 in | Wt 147.8 lb

## 2019-07-10 DIAGNOSIS — E559 Vitamin D deficiency, unspecified: Secondary | ICD-10-CM

## 2019-07-10 DIAGNOSIS — R7303 Prediabetes: Secondary | ICD-10-CM | POA: Diagnosis not present

## 2019-07-10 DIAGNOSIS — H6123 Impacted cerumen, bilateral: Secondary | ICD-10-CM

## 2019-07-10 DIAGNOSIS — E119 Type 2 diabetes mellitus without complications: Secondary | ICD-10-CM | POA: Diagnosis not present

## 2019-07-10 DIAGNOSIS — I10 Essential (primary) hypertension: Secondary | ICD-10-CM | POA: Diagnosis not present

## 2019-07-10 LAB — POCT GLYCOSYLATED HEMOGLOBIN (HGB A1C): Hemoglobin A1C: 5.9 % — AB (ref 4.0–5.6)

## 2019-07-10 LAB — BASIC METABOLIC PANEL: Glucose: 117

## 2019-07-10 MED ORDER — DEBROX 6.5 % OT SOLN: 5.0000 [drp] | Freq: Two times a day (BID) | OTIC | 0 refills | Status: DC

## 2019-07-10 NOTE — Progress Notes (Signed)
[facility] [place], [place] [address]  Internal MEDICINE  Office Visit Note  Patient Name: Bonnie Washington  [id number]  [id number]  Date of Service: 07/10/2019  Chief Complaint  Patient presents with  . Follow-up  . Diabetes  . Depression  . Hypertension    HPI  Pt is here for follow up on HTN, DM, depression.  Her A1C today is 5.9.  Overall she is doing well with controlling her blood sugar. She remains pre-diabetic.  Her depression is controlled and she denies any issues at this time. Her blood pressure is controlled 140/80. Denies Chest pain, Shortness of breath, palpitations, headache, or blurred vision.    Current Medication: Outpatient Encounter Medications as of 07/10/2019  Medication Sig  . aspirin EC 81 MG tablet Take 81 mg by mouth daily.  . benzonatate (TESSALON) 200 MG capsule Take 1 capsule (200 mg total) by mouth 2 (two) times daily as needed for cough.  . docusate sodium (COLACE) 100 MG capsule Take 1 capsule (100 mg total) by mouth daily as needed.  . ergocalciferol (DRISDOL) 1.25 MG (50000 UT) capsule Take 1 capsule (50,000 Units total) by mouth once a week.  . fluticasone (FLONASE) 50 MCG/ACT nasal spray PLACE 1 SPRAY IN EACH NOSTRIL AT BEDTIME  . hydrochlorothiazide (MICROZIDE) 12.5 MG capsule TAKE ONE CAPSULE BY MOUTH EVERY DAY FOR BLOOD PRESSURE  . HYDROcodone-acetaminophen (NORCO/VICODIN) 5-325 MG tablet Take 1 tablet by mouth every 4 (four) hours as needed for moderate pain.  . meloxicam (MOBIC) 15 MG tablet Take 15 mg by mouth daily.  Marland Kitchen venlafaxine XR (EFFEXOR-XR) 150 MG 24 hr capsule TAKE 150 MG BY MOUTH DAILY   No facility-administered encounter medications on file as of 07/10/2019.    Surgical History: Past Surgical History:  Procedure Laterality Date  . CHOLECYSTECTOMY  2014  . FRACTURE SURGERY Right 2004   elbow  . KNEE ARTHROSCOPY WITH MEDIAL MENISECTOMY Left 09/21/2018   Procedure: KNEE ARTHROSCOPY WITH MEDIAL AND LATERAL  MENISECTOMY, SYNOVECTOMY, CHONDROPLASTY;  Surgeon: Lovell Sheehan, MD;  Location: [place];  Service: Orthopedics;  Laterality: Left;  Diabetes-diet controlled  . OPEN REDUCTION INTERNAL FIXATION (ORIF) DISTAL RADIAL FRACTURE Left 10/06/2015   Procedure: OPEN REDUCTION INTERNAL FIXATION (ORIF) DISTAL RADIAL FRACTURE;  Surgeon: Earnestine Leys, MD;  Location: [facility];  Service: Orthopedics;  Laterality: Left;  Marland Kitchen VENTRAL HERNIA REPAIR N/A 06/10/2017   Procedure: HERNIA REPAIR VENTRAL ADULT;  Surgeon: Robert Bellow, MD;  Location: [facility];  Service: General;  Laterality: N/A;    Medical History: Past Medical History:  Diagnosis Date  . Depression   . Diabetes mellitus without complication ([place])    pt use to take metformin but has not taken it in 89yrs/lost weight/ stopped metformin/ watches diet  . Hypertension    controlled on meds    Family History: Family History  Problem Relation Age of Onset  . Hypertension Mother   . Breast cancer Cousin     Social History   Socioeconomic History  . Marital status: Married    Spouse name: Not on file  . Number of children: Not on file  . Years of education: Not on file  . Highest education level: Not on file  Occupational History  . Not on file  Tobacco Use  . Smoking status: Former Smoker    Packs/day: 0.50    Years: 20.00    Pack years: 10.00    Types: Cigarettes    Quit date: 03/08/1993  Years since quitting: 26.3  . Smokeless tobacco: Never Used  Substance and Sexual Activity  . Alcohol use: Yes    Alcohol/week: 7.0 standard drinks    Types: 7 Cans of beer per week    Comment: occasionally  . Drug use: No  . Sexual activity: Not on file  Other Topics Concern  . Not on file  Social History Narrative  . Not on file   Social Determinants of Health   Financial Resource Strain:   . Difficulty of Paying Living Expenses:   Food Insecurity:   . Worried About Charity fundraiser in the Last Year:   . Academic librarian in the Last Year:   Transportation Needs:   . Film/video editor (Medical):   Marland Kitchen Lack of Transportation (Non-Medical):   Physical Activity:   . Days of Exercise per Week:   . Minutes of Exercise per Session:   Stress:   . Feeling of Stress :   Social Connections:   . Frequency of Communication with Friends and Family:   . Frequency of Social Gatherings with Friends and Family:   . Attends Religious Services:   . Active Member of Clubs or Organizations:   . Attends Archivist Meetings:   Marland Kitchen Marital Status:   Intimate Partner Violence:   . Fear of Current or Ex-Partner:   . Emotionally Abused:   Marland Kitchen Physically Abused:   . Sexually Abused:     Review of Systems  Constitutional: Negative for chills, fatigue and unexpected weight change.  HENT: Negative for congestion, rhinorrhea, sneezing and sore throat.   Eyes: Negative for photophobia, pain and redness.  Respiratory: Negative for cough, chest tightness and shortness of breath.   Cardiovascular: Negative for chest pain and palpitations.  Gastrointestinal: Negative for abdominal pain, constipation, diarrhea, nausea and vomiting.  Endocrine: Negative.   Genitourinary: Negative for dysuria and frequency.  Musculoskeletal: Negative for arthralgias, back pain, joint swelling and neck pain.  Skin: Negative for rash.  Allergic/Immunologic: Negative.   Neurological: Negative for tremors and numbness.  Hematological: Negative for adenopathy. Does not bruise/bleed easily.  Psychiatric/Behavioral: Negative for behavioral problems and sleep disturbance. The patient is not nervous/anxious.     Vital Signs: BP (!) 154/68   Pulse 73   Temp (!) 97.4 F (36.3 C)   Resp 16   Ht 5\' 4"  (1.626 m)   Wt 147 lb 12.8 oz (67 kg)   SpO2 96%   BMI 25.37 kg/m    Physical Exam Vitals and nursing note reviewed.  Constitutional:      General: She is not in acute distress.    Appearance: She is well-developed. She is not  diaphoretic.  HENT:     Head: Normocephalic and atraumatic.     Mouth/Throat:     Pharynx: No oropharyngeal exudate.  Eyes:     Pupils: Pupils are equal, round, and reactive to light.  Neck:     Thyroid: No thyromegaly.     Vascular: No JVD.     Trachea: No tracheal deviation.  Cardiovascular:     Rate and Rhythm: Normal rate and regular rhythm.     Heart sounds: Normal heart sounds. No murmur. No friction rub. No gallop.   Pulmonary:     Effort: Pulmonary effort is normal. No respiratory distress.     Breath sounds: Normal breath sounds. No wheezing or rales.  Chest:     Chest wall: No tenderness.  Abdominal:  Palpations: Abdomen is soft.     Tenderness: There is no abdominal tenderness. There is no guarding.  Musculoskeletal:        General: Normal range of motion.     Cervical back: Normal range of motion and neck supple.  Lymphadenopathy:     Cervical: No cervical adenopathy.  Skin:    General: Skin is warm and dry.  Neurological:     Mental Status: She is alert and oriented to person, place, and time.     Cranial Nerves: No cranial nerve deficit.  Psychiatric:        Behavior: Behavior normal.        Thought Content: Thought content normal.        Judgment: Judgment normal.    Assessment/Plan: 1. Essential hypertension Recheck CBC and CMP.   - CBC w/Diff/Platelet - Comprehensive metabolic panel  2. Vitamin D deficiency Check Vit D level.  - Vitamin D (25 hydroxy)  3. Prediabetes A1C is controlled. Continue dietary modifications, and exercise.  - POCT HgB A1C  4. Impacted cerumen of both ears Use debrox as directed. - carbamide peroxide (DEBROX) 6.5 % OTIC solution; Place 5 drops into both ears 2 (two) times daily.  Dispense: 15 mL; Refill: 0  General Counseling: Bonnie Washington verbalizes understanding of the findings of todays visit and agrees with plan of treatment. I have discussed any further diagnostic evaluation that may be needed or ordered today. We also  reviewed her medications today. she has been encouraged to call the office with any questions or concerns that should arise related to todays visit.    Orders Placed This Encounter  Procedures  . POCT HgB A1C    No orders of the defined types were placed in this encounter.   Time spent: 30 Minutes   This patient was seen by Orson Gear AGNP-C in Collaboration with Dr Lavera Guise as a part of collaborative care agreement     Kendell Bane AGNP-C Internal medicine

## 2019-07-11 LAB — CBC WITH DIFFERENTIAL/PLATELET
Basophils Absolute: 0 10*3/uL (ref 0.0–0.2)
Basos: 1 %
EOS (ABSOLUTE): 0.1 10*3/uL (ref 0.0–0.4)
Eos: 1 %
Hematocrit: 39.8 % (ref 34.0–46.6)
Hemoglobin: 13.6 g/dL (ref 11.1–15.9)
Immature Grans (Abs): 0 10*3/uL (ref 0.0–0.1)
Immature Granulocytes: 0 %
Lymphocytes Absolute: 1.8 10*3/uL (ref 0.7–3.1)
Lymphs: 32 %
MCH: 32.6 pg (ref 26.6–33.0)
MCHC: 34.2 g/dL (ref 31.5–35.7)
MCV: 95 fL (ref 79–97)
Monocytes Absolute: 0.5 10*3/uL (ref 0.1–0.9)
Monocytes: 8 %
Neutrophils Absolute: 3.3 10*3/uL (ref 1.4–7.0)
Neutrophils: 58 %
Platelets: 276 10*3/uL (ref 150–450)
RBC: 4.17 x10E6/uL (ref 3.77–5.28)
RDW: 11.8 % (ref 11.7–15.4)
WBC: 5.7 10*3/uL (ref 3.4–10.8)

## 2019-07-11 LAB — COMPREHENSIVE METABOLIC PANEL
ALT: 16 IU/L (ref 0–32)
AST: 15 IU/L (ref 0–40)
Albumin/Globulin Ratio: 2.5 — ABNORMAL HIGH (ref 1.2–2.2)
Albumin: 4.7 g/dL (ref 3.7–4.7)
Alkaline Phosphatase: 87 IU/L (ref 39–117)
BUN/Creatinine Ratio: 14 (ref 12–28)
BUN: 9 mg/dL (ref 8–27)
Bilirubin Total: 0.4 mg/dL (ref 0.0–1.2)
CO2: 26 mmol/L (ref 20–29)
Calcium: 9.4 mg/dL (ref 8.7–10.3)
Chloride: 94 mmol/L — ABNORMAL LOW (ref 96–106)
Creatinine, Ser: 0.65 mg/dL (ref 0.57–1.00)
GFR calc Af Amer: 103 mL/min/{1.73_m2} (ref >59)
GFR calc non Af Amer: 89 mL/min/{1.73_m2} (ref >59)
Globulin, Total: 1.9 g/dL (ref 1.5–4.5)
Glucose: 117 mg/dL — ABNORMAL HIGH (ref 65–99)
Potassium: 4.6 mmol/L (ref 3.5–5.2)
Sodium: 136 mmol/L (ref 134–144)
Total Protein: 6.6 g/dL (ref 6.0–8.5)

## 2019-07-11 LAB — VITAMIN D 25 HYDROXY (VIT D DEFICIENCY, FRACTURES): Vit D, 25-Hydroxy: 32.2 ng/mL (ref 30.0–100.0)

## 2019-07-24 ENCOUNTER — Other Ambulatory Visit: Payer: Self-pay | Admitting: Adult Health

## 2019-07-30 ENCOUNTER — Telehealth: Payer: Self-pay

## 2019-07-30 NOTE — Telephone Encounter (Signed)
-----   Message from [name], NP sent at 07/30/2019  9:09 AM EDT ----- Hey. Please let the patient know that her labs are good. Thanks.

## 2019-07-30 NOTE — Telephone Encounter (Signed)
Pt was notified.  

## 2019-07-30 NOTE — Progress Notes (Signed)
Hey. Please let the patient know that her labs are good. Thanks.

## 2019-11-13 ENCOUNTER — Other Ambulatory Visit: Payer: Self-pay

## 2019-11-13 ENCOUNTER — Ambulatory Visit (INDEPENDENT_AMBULATORY_CARE_PROVIDER_SITE_OTHER): Payer: PPO | Admitting: Nurse Practitioner

## 2019-11-13 ENCOUNTER — Encounter: Payer: Self-pay | Admitting: Nurse Practitioner

## 2019-11-13 VITALS — BP 152/74 | HR 77 | Temp 97.8°F | Resp 16 | Ht 64.0 in | Wt 146.8 lb

## 2019-11-13 DIAGNOSIS — Y92009 Unspecified place in unspecified non-institutional (private) residence as the place of occurrence of the external cause: Secondary | ICD-10-CM | POA: Diagnosis not present

## 2019-11-13 DIAGNOSIS — S0003XA Contusion of scalp, initial encounter: Secondary | ICD-10-CM

## 2019-11-13 DIAGNOSIS — Z23 Encounter for immunization: Secondary | ICD-10-CM | POA: Diagnosis not present

## 2019-11-13 DIAGNOSIS — W19XXXA Unspecified fall, initial encounter: Secondary | ICD-10-CM

## 2019-11-13 NOTE — Progress Notes (Signed)
[facility] [place], [place] [address]  Internal MEDICINE  Office Visit Note  Patient Name: Bonnie Washington  [id number]  [id number]  Date of Service: 11/13/2019  Chief Complaint  Patient presents with  . Acute Visit    pt hit head on concrete, had bruising on right side of neck, pt needs left knee replacement and was walking up stairs when she fell 2 weeks ago  . Depression  . Diabetes  . Hypertension  . Quality Metric Gaps    HepC,  TDAP, mammogram     The patient is here for acute visit. She states that, while at home, her left knee gave out on her, causing her to fall. She fell backwards, hitting her head on the banister and concrete. She developed a large lump on the top right part of her head. She was told she did lose consciousness for a short period of time. She states that she does not remember losing consciousness. She states that she did urinate on herself during the fall. She states that when she woke up, she did not have dizziness, nausea, vomiting, or bad headache. She states that she iced her head and neck. Stayed awake for several hours after that. Never really developed a bad headache. She states that after two weeks, the lump on her head is still present and this is worrisome to her family more to her.   Pt is here for a sick visit.     Current Medication:  Outpatient Encounter Medications as of 11/13/2019  Medication Sig  . aspirin EC 81 MG tablet Take 81 mg by mouth daily.  . fluticasone (FLONASE) 50 MCG/ACT nasal spray PLACE 1 SPRAY IN EACH NOSTRIL AT BEDTIME  . hydrochlorothiazide (MICROZIDE) 12.5 MG capsule TAKE ONE CAPSULE BY MOUTH EVERY DAY FOR BLOOD PRESSURE  . meloxicam (MOBIC) 15 MG tablet Take 15 mg by mouth daily.  Marland Kitchen venlafaxine XR (EFFEXOR-XR) 150 MG 24 hr capsule TAKE 1 CAPSULE BY MOUTH EVERY DAY  . benzonatate (TESSALON) 200 MG capsule Take 1 capsule (200 mg total) by mouth 2 (two) times daily as needed for cough. (Patient not  taking: Reported on 11/13/2019)  . carbamide peroxide (DEBROX) 6.5 % OTIC solution Place 5 drops into both ears 2 (two) times daily. (Patient not taking: Reported on 11/13/2019)  . ergocalciferol (DRISDOL) 1.25 MG (50000 UT) capsule Take 1 capsule (50,000 Units total) by mouth once a week. (Patient not taking: Reported on 11/13/2019)   No facility-administered encounter medications on file as of 11/13/2019.      Medical History: Past Medical History:  Diagnosis Date  . Depression   . Diabetes mellitus without complication ([place])    pt use to take metformin but has not taken it in 42yrs/lost weight/ stopped metformin/ watches diet  . Hypertension    controlled on meds     Today's Vitals   11/13/19 1534  BP: (!) 152/74  Pulse: 77  Resp: 16  Temp: 97.8 F (36.6 C)  SpO2: 98%  Weight: 146 lb 12.8 oz (66.6 kg)  Height: 5\' 4"  (1.626 m)   Body mass index is 25.2 kg/m.  Review of Systems  Constitutional: Negative for activity change, chills, fatigue and unexpected weight change.  HENT: Negative for congestion, postnasal drip, rhinorrhea, sneezing and sore throat.        The patient has lump on the right side of her head from fall she had two weeks ago. She states that this is gradually getting  smaller. Still tender.   Respiratory: Negative for cough, chest tightness, shortness of breath and wheezing.   Cardiovascular: Negative for chest pain and palpitations.       Mild elevation of blood pressure.   Gastrointestinal: Negative for abdominal pain, constipation, diarrhea, nausea and vomiting.  Musculoskeletal: Positive for neck pain. Negative for arthralgias, back pain and joint swelling.  Skin: Negative for rash.  Neurological: Negative for dizziness, tremors, numbness and headaches.  Hematological: Negative for adenopathy. Does not bruise/bleed easily.  Psychiatric/Behavioral: Negative for behavioral problems (Depression), confusion, sleep disturbance and suicidal ideas. The patient is  not nervous/anxious.     Physical Exam Vitals and nursing note reviewed.  Constitutional:      General: She is not in acute distress.    Appearance: Normal appearance. She is well-developed. She is not diaphoretic.  HENT:     Head: Normocephalic and atraumatic.      Mouth/Throat:     Pharynx: No oropharyngeal exudate.  Eyes:     Pupils: Pupils are equal, round, and reactive to light.  Neck:     Thyroid: No thyromegaly.     Vascular: No JVD.     Trachea: No tracheal deviation.     Comments: Some neck tenderness when turning her head to the right and with neck extension. No abnormalities or bruising noted.  Cardiovascular:     Rate and Rhythm: Normal rate and regular rhythm.     Heart sounds: Normal heart sounds. No murmur heard.  No friction rub. No gallop.   Pulmonary:     Effort: Pulmonary effort is normal. No respiratory distress.     Breath sounds: No wheezing or rales.  Chest:     Chest wall: No tenderness.  Abdominal:     General: Bowel sounds are normal.     Palpations: Abdomen is soft.  Musculoskeletal:        General: Normal range of motion.     Cervical back: Normal range of motion and neck supple.  Lymphadenopathy:     Cervical: No cervical adenopathy.  Skin:    General: Skin is warm and dry.  Neurological:     General: No focal deficit present.     Mental Status: She is alert and oriented to person, place, and time.     Cranial Nerves: No cranial nerve deficit.     Motor: No weakness.     Coordination: Coordination normal.     Gait: Gait normal.     Deep Tendon Reflexes: Reflexes normal.  Psychiatric:        Behavior: Behavior normal.        Thought Content: Thought content normal.        Judgment: Judgment normal.   Assessment/Plan: 1. Hematoma of right parietal scalp, initial encounter Small, palpable hematoma of right side of scalp with mild bruising. Patient is neurologically intact. Will have her closely monitoring symptoms. Expect this to resolve  over the next few weeks.   2. Fall at home, initial encounter Fall causing hematoma on right side of scalp. Will monitor.   3. Flu vaccine need Flu vaccine administered In the office.  - Flu Vaccine MDCK QUAD PF  General Counseling: Bonnie Washington verbalizes understanding of the findings of todays visit and agrees with plan of treatment. I have discussed any further diagnostic evaluation that may be needed or ordered today. We also reviewed her medications today. she has been encouraged to call the office with any questions or concerns that should arise related to todays  visit.    Counseling:  This patient was seen by Leretha Pol FNP Collaboration with Dr Lavera Guise as a part of collaborative care agreement  Orders Placed This Encounter  Procedures  . Flu Vaccine MDCK QUAD PF     Time spent: 30 Minutes

## 2019-12-25 IMAGING — CR DG HIP (WITH OR WITHOUT PELVIS) 2-3V*L*
1 series · 3 of 3 positions shown · non-contrast
Comparison: None.

CLINICAL DATA: 73-year-old female with pain left groin for 6 weeks.
Pain extends down left femur for 2 weeks. No known injury. Initial
encounter.

EXAM:
DG HIP (WITH OR WITHOUT PELVIS) 2-3V LEFT

[Series 1: dg hip unilat w or w/o pelvis 2-3 views  · non-contrast · 0.14mm/px · 3 of 3 slices shown]
[im 1/3]
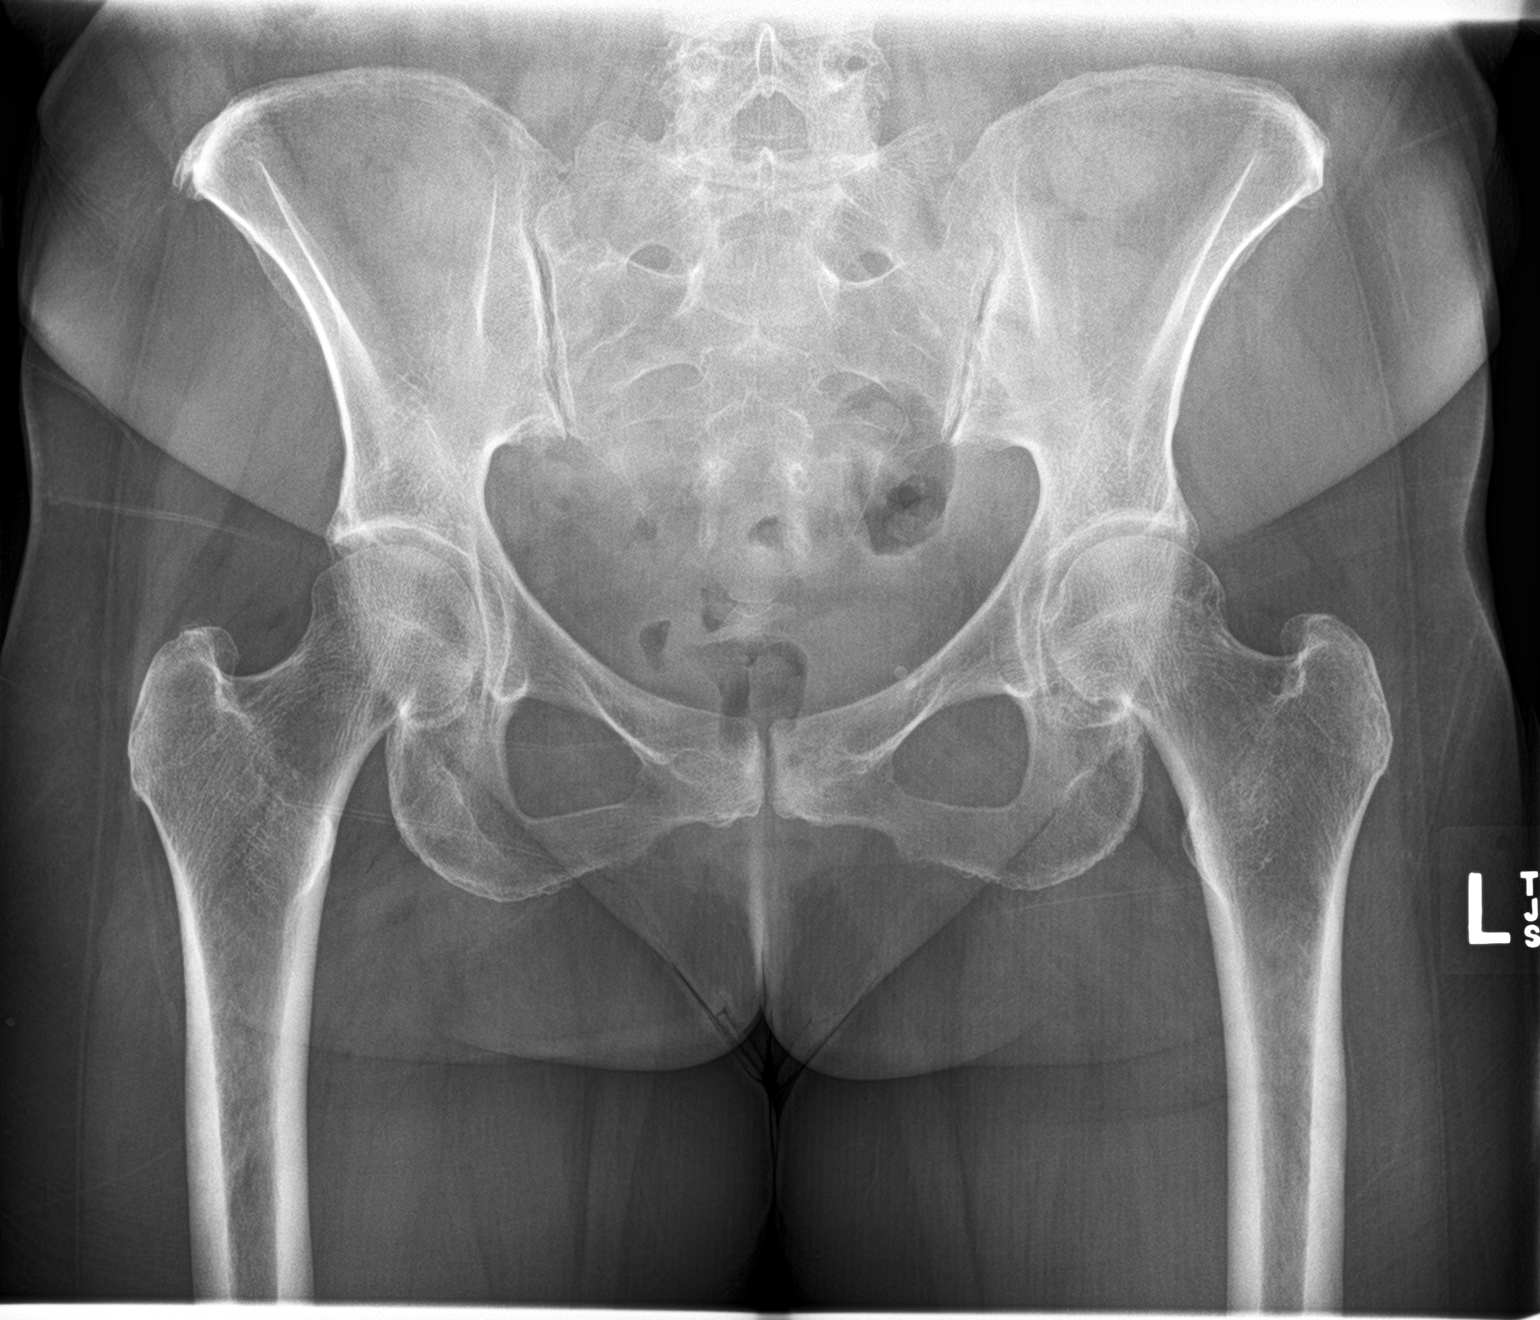
[im 2/3]
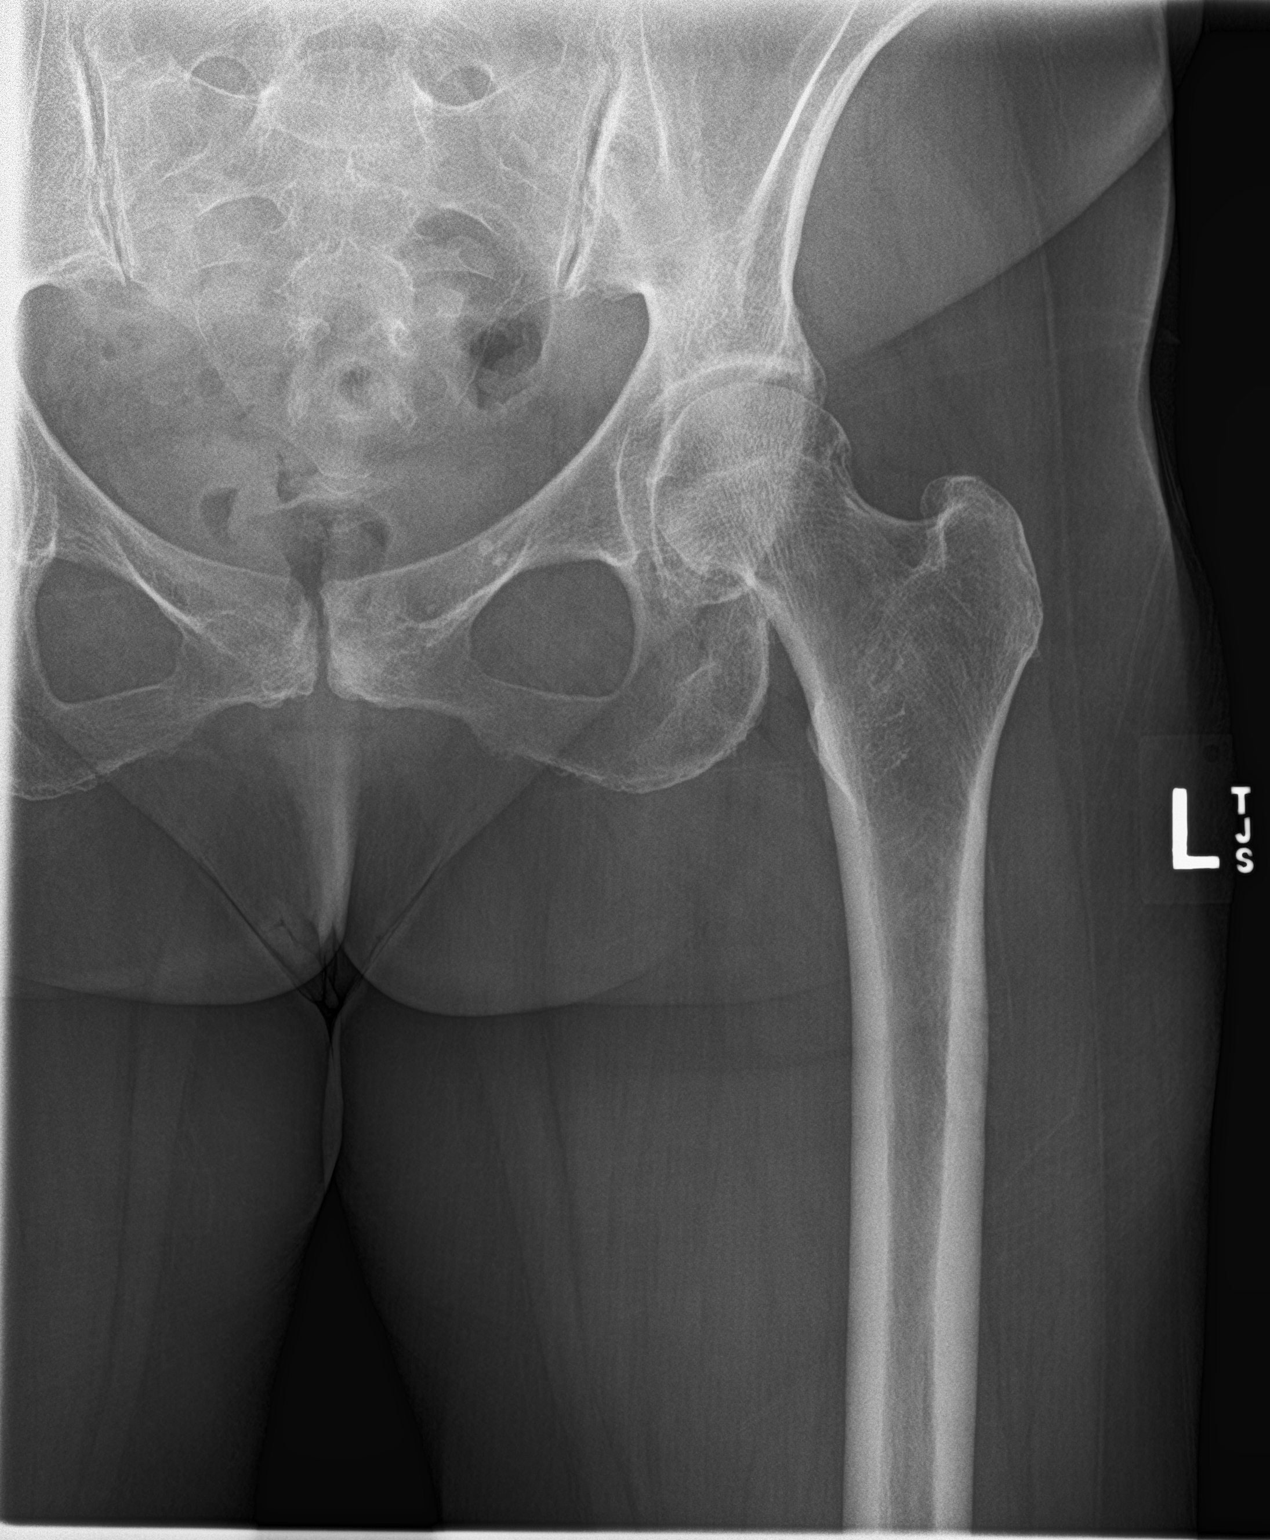
[im 3/3]
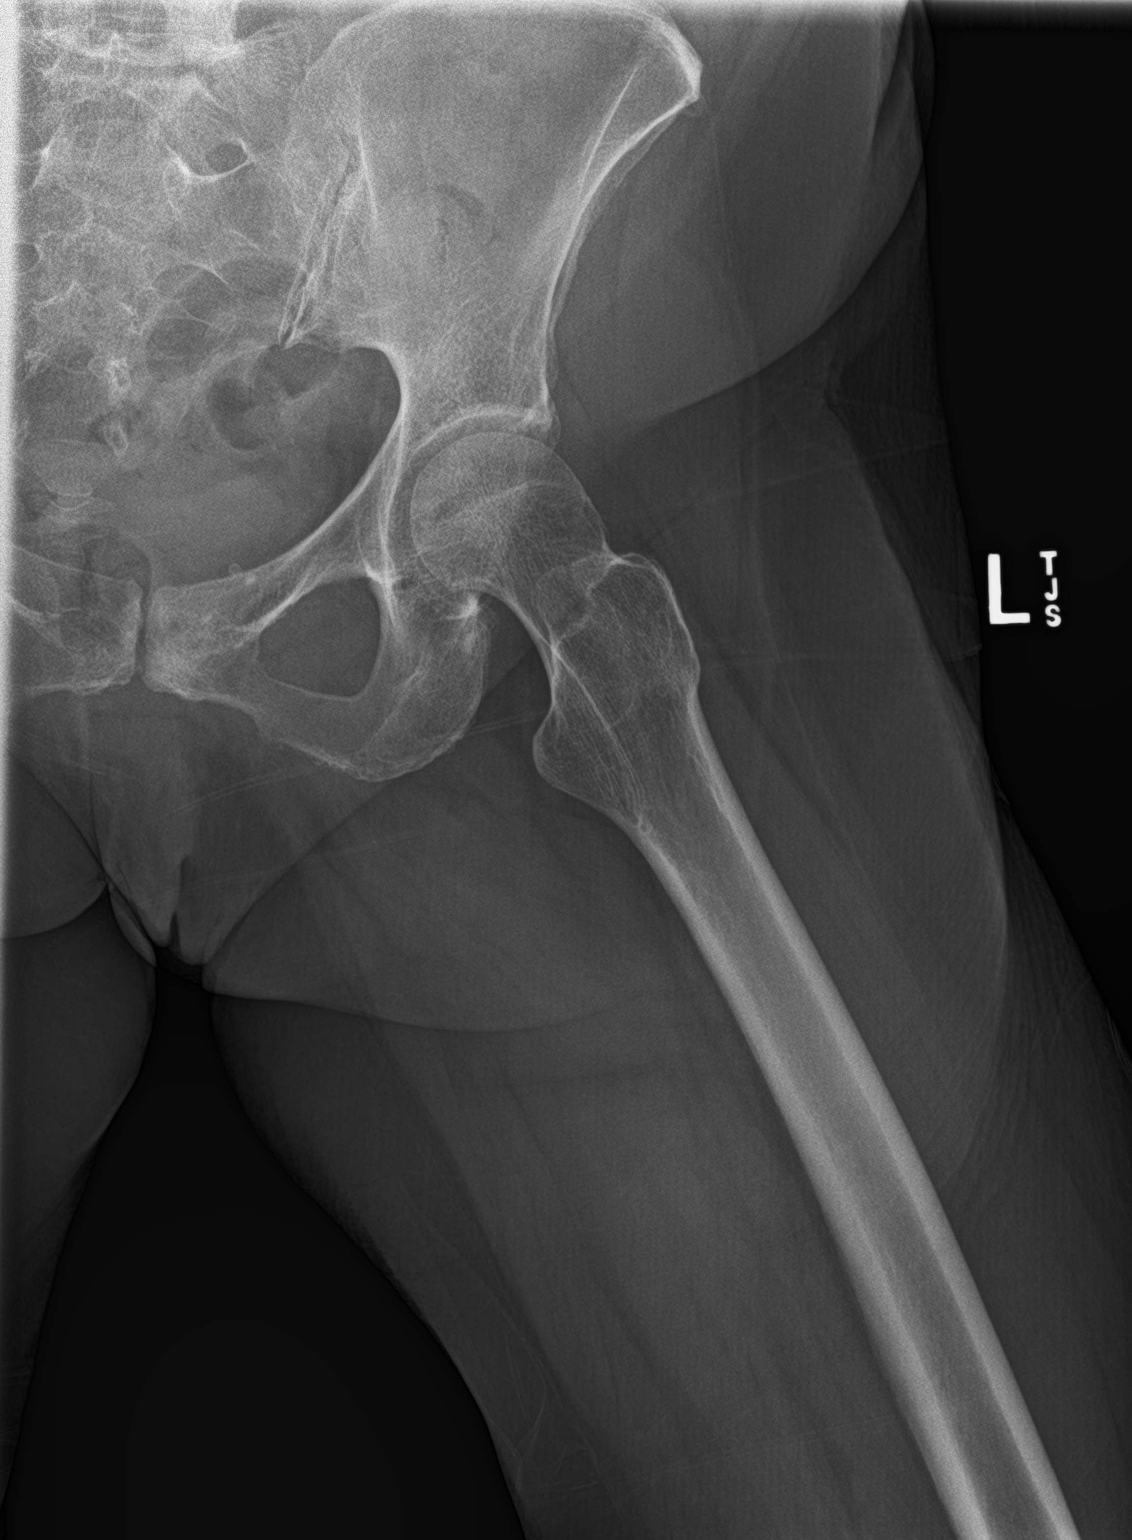

[3 of 3 positions shown; findings below may reference images not displayed]

FINDINGS: There is no evidence of hip fracture or dislocation. There is no
evidence of arthropathy or other focal bone abnormality. No plain
film evidence of left femoral head avascular necrosis. Mild pubic
symphysis degenerative changes. Sacroiliac joints appear intact.
IMPRESSION: Mild degenerative changes pubic symphysis otherwise negative plain
film exam of the left hip.

## 2020-02-14 ENCOUNTER — Ambulatory Visit: Payer: PPO | Admitting: Nurse Practitioner

## 2020-02-18 ENCOUNTER — Ambulatory Visit (INDEPENDENT_AMBULATORY_CARE_PROVIDER_SITE_OTHER): Payer: PPO | Admitting: Nurse Practitioner

## 2020-02-18 ENCOUNTER — Other Ambulatory Visit: Payer: Self-pay

## 2020-02-18 ENCOUNTER — Encounter: Payer: Self-pay | Admitting: Nurse Practitioner

## 2020-02-18 VITALS — BP 150/80 | HR 75 | Temp 97.4°F | Resp 16 | Ht 63.0 in | Wt 147.6 lb

## 2020-02-18 DIAGNOSIS — Z1231 Encounter for screening mammogram for malignant neoplasm of breast: Secondary | ICD-10-CM

## 2020-02-18 DIAGNOSIS — Z23 Encounter for immunization: Secondary | ICD-10-CM | POA: Diagnosis not present

## 2020-02-18 DIAGNOSIS — F331 Major depressive disorder, recurrent, moderate: Secondary | ICD-10-CM

## 2020-02-18 DIAGNOSIS — Z0001 Encounter for general adult medical examination with abnormal findings: Secondary | ICD-10-CM

## 2020-02-18 DIAGNOSIS — R300 Dysuria: Secondary | ICD-10-CM | POA: Diagnosis not present

## 2020-02-18 DIAGNOSIS — I10 Essential (primary) hypertension: Secondary | ICD-10-CM

## 2020-02-18 MED ORDER — ATENOLOL 25 MG PO TABS: ORAL_TABLET | ORAL | 1 refills | Status: DC

## 2020-02-18 MED ORDER — VENLAFAXINE HCL ER 75 MG PO CP24: ORAL_CAPSULE | ORAL | 1 refills | Status: DC

## 2020-02-18 MED ORDER — PNEUMOCOCCAL 13-VAL CONJ VACC IM SUSP: 0.5000 mL | Freq: Once | INTRAMUSCULAR | 0 refills | Status: AC

## 2020-02-18 NOTE — Progress Notes (Signed)
[facility] [place], [place] [address]  Internal MEDICINE  Office Visit Note  Patient Name: Bonnie Washington  [id number]  [id number]  Date of Service: 02/24/2020   Pt is here for routine health maintenance examination  Chief Complaint  Patient presents with   Medicare Wellness   Depression   Diabetes   Hypertension     The patient is here for health maintenance exam.  -increased levels of depression. Feels very overwhelmed. She has increased family stress and states that sometimes it feels like she cannot deal with another thing. She is tearful throughout the visit. She currently takes venlafaxine ER 150mg  tablets daily.  -she is due to have routine, fasting labs.  -due to have screening mammogram.  -states that she should have Prevnar13 vaccine.  -blood pressure mildly elevated today.   Current Medication: Outpatient Encounter Medications as of 02/18/2020  Medication Sig   aspirin EC 81 MG tablet Take 81 mg by mouth daily.   fluticasone (FLONASE) 50 MCG/ACT nasal spray PLACE 1 SPRAY IN EACH NOSTRIL AT BEDTIME   meloxicam (MOBIC) 15 MG tablet Take 15 mg by mouth daily.   [DISCONTINUED] venlafaxine XR (EFFEXOR-XR) 150 MG 24 hr capsule TAKE 1 CAPSULE BY MOUTH EVERY DAY   atenolol (TENORMIN) 25 MG tablet Take 1/2 tablet po QPM   [EXPIRED] pneumococcal 13-valent conjugate vaccine (PREVNAR 13) SUSP injection Inject 0.5 mLs into the muscle once for 1 dose.   venlafaxine XR (EFFEXOR-XR) 75 MG 24 hr capsule Take 3 capsules po QD   [DISCONTINUED] benzonatate (TESSALON) 200 MG capsule Take 1 capsule (200 mg total) by mouth 2 (two) times daily as needed for cough. (Patient not taking: No sig reported)   [DISCONTINUED] carbamide peroxide (DEBROX) 6.5 % OTIC solution Place 5 drops into both ears 2 (two) times daily. (Patient not taking: Reported on 11/13/2019)   [DISCONTINUED] ergocalciferol (DRISDOL) 1.25 MG (50000 UT) capsule Take 1 capsule (50,000  Units total) by mouth once a week. (Patient not taking: Reported on 11/13/2019)   [DISCONTINUED] hydrochlorothiazide (MICROZIDE) 12.5 MG capsule TAKE ONE CAPSULE BY MOUTH EVERY DAY FOR BLOOD PRESSURE   No facility-administered encounter medications on file as of 02/18/2020.    Surgical History: Past Surgical History:  Procedure Laterality Date   CHOLECYSTECTOMY  2014   FRACTURE SURGERY Right 2004   elbow   KNEE ARTHROSCOPY WITH MEDIAL MENISECTOMY Left 09/21/2018   Procedure: KNEE ARTHROSCOPY WITH MEDIAL AND LATERAL MENISECTOMY, SYNOVECTOMY, CHONDROPLASTY;  Surgeon: Lovell Sheehan, MD;  Location: [place];  Service: Orthopedics;  Laterality: Left;  Diabetes-diet controlled   OPEN REDUCTION INTERNAL FIXATION (ORIF) DISTAL RADIAL FRACTURE Left 10/06/2015   Procedure: OPEN REDUCTION INTERNAL FIXATION (ORIF) DISTAL RADIAL FRACTURE;  Surgeon: Earnestine Leys, MD;  Location: [facility];  Service: Orthopedics;  Laterality: Left;   VENTRAL HERNIA REPAIR N/A 06/10/2017   Procedure: HERNIA REPAIR VENTRAL ADULT;  Surgeon: Robert Bellow, MD;  Location: [facility];  Service: General;  Laterality: N/A;    Medical History: Past Medical History:  Diagnosis Date   Depression    Diabetes mellitus without complication ([place])    pt use to take metformin but has not taken it in 49yrs/lost weight/ stopped metformin/ watches diet   Hypertension    controlled on meds    Family History: Family History  Problem Relation Age of Onset   Hypertension Mother    Breast cancer Cousin       Review of Systems  Constitutional: Positive for fatigue.  Negative for activity change, chills and unexpected weight change.  HENT: Negative for congestion, postnasal drip, rhinorrhea, sneezing and sore throat.   Respiratory: Negative for apnea, cough, chest tightness, shortness of breath and wheezing.   Cardiovascular: Negative for chest pain and palpitations.       Elevated blood pressure today.    Gastrointestinal: Negative for abdominal pain, constipation, diarrhea, nausea and vomiting.  Endocrine: Negative for cold intolerance, heat intolerance, polydipsia and polyuria.  Genitourinary: Negative for dysuria, frequency and urgency.  Musculoskeletal: Negative for arthralgias, back pain, joint swelling and neck pain.  Skin: Negative for rash.  Allergic/Immunologic: Negative for environmental allergies.  Neurological: Negative for dizziness, tremors, numbness and headaches.  Hematological: Negative for adenopathy. Does not bruise/bleed easily.  Psychiatric/Behavioral: Positive for dysphoric mood. Negative for behavioral problems (Depression), sleep disturbance and suicidal ideas. The patient is nervous/anxious.      Today's Vitals   02/18/20 1007  BP: (!) 150/80  Pulse: 75  Resp: 16  Temp: (!) 97.4 F (36.3 C)  SpO2: 98%  Weight: 147 lb 9.6 oz (67 kg)  Height: 5\' 3"  (1.6 m)   Body mass index is 26.15 kg/m.  Physical Exam Vitals and nursing note reviewed.  Constitutional:      General: She is not in acute distress.    Appearance: Normal appearance. She is well-developed and well-nourished. She is not diaphoretic.  HENT:     Head: Normocephalic and atraumatic.     Nose: Nose normal.     Mouth/Throat:     Mouth: Oropharynx is clear and moist.     Pharynx: No oropharyngeal exudate.  Eyes:     Extraocular Movements: EOM normal.     Pupils: Pupils are equal, round, and reactive to light.  Neck:     Thyroid: No thyromegaly.     Vascular: No carotid bruit or JVD.     Trachea: No tracheal deviation.  Cardiovascular:     Rate and Rhythm: Normal rate and regular rhythm.     Pulses: Normal pulses.     Heart sounds: Normal heart sounds. No murmur heard. No friction rub. No gallop.   Pulmonary:     Effort: Pulmonary effort is normal. No respiratory distress.     Breath sounds: Normal breath sounds. No wheezing or rales.  Chest:     Chest wall: No tenderness.  Breasts:      Right: Normal. No swelling, bleeding, inverted nipple, mass, nipple discharge, skin change, tenderness or axillary adenopathy.     Left: No swelling, bleeding, inverted nipple, mass, nipple discharge, skin change, tenderness or axillary adenopathy.    Abdominal:     General: Bowel sounds are normal.     Palpations: Abdomen is soft.     Tenderness: There is no abdominal tenderness.  Musculoskeletal:        General: Normal range of motion.     Cervical back: Normal range of motion and neck supple.  Lymphadenopathy:     Cervical: No cervical adenopathy.     Upper Body:     Right upper body: No axillary adenopathy.     Left upper body: No axillary adenopathy.  Skin:    General: Skin is warm and dry.  Neurological:     General: No focal deficit present.     Mental Status: She is alert and oriented to person, place, and time.     Cranial Nerves: No cranial nerve deficit.  Psychiatric:        Attention and Perception: Attention  and perception normal.        Mood and Affect: Mood is anxious and depressed. Affect is tearful.        Speech: Speech normal.        Behavior: Behavior normal. Behavior is cooperative.        Thought Content: Thought content normal.        Cognition and Memory: Cognition and memory normal.        Judgment: Judgment normal.    Depression screen [facility] 2/9 02/18/2020 11/13/2019 02/13/2019 09/14/2018 04/17/2018  Decreased Interest 0 0 0 0 0  Down, Depressed, Hopeless 0 0 0 0 0  PHQ - 2 Score 0 0 0 0 0    Functional Status Survey: Is the patient deaf or have difficulty hearing?: Yes Does the patient have difficulty seeing, even when wearing glasses/contacts?: No Does the patient have difficulty concentrating, remembering, or making decisions?: No Does the patient have difficulty walking or climbing stairs?: No Does the patient have difficulty dressing or bathing?: No Does the patient have difficulty doing errands alone such as visiting a doctor's office or  shopping?: No  MMSE - [place] Exam 02/18/2020 02/13/2019 10/13/2017  Orientation to time 5 5 5   Orientation to Place 5 5 5   Registration 3 3 3   Attention/ Calculation 5 5 5   Recall 3 3 3   Language- name 2 objects 2 2 2   Language- repeat 1 1 1   Language- follow 3 step command 3 3 3   Language- read & follow direction 1 1 1   Write a sentence 1 1 1   Copy design 1 1 1   Total score 30 30 30     Fall Risk  02/18/2020 11/13/2019 02/13/2019 09/14/2018 04/17/2018  Falls in the past year? 1 1 0 0 0  Number falls in past yr: 0 1 - - 0  Injury with Fall? 1 1 - - 0  Comment hit head pt hit head on concrete, had bruising on right side of neck, pt needs left knee replacement and was walking up stairs when she fell 2 weeks ago - - -  Follow up Falls evaluation completed - - - -      LABS: Recent Results (from the past 2160 hour(s))  UA/M w/rflx Culture, Routine     Status: Abnormal   Collection Time: 02/18/20 10:21 AM   Specimen: Urine   Urine  Result Value Ref Range   Specific Gravity, UA 1.007 1.005 - 1.030   pH, UA 5.5 5.0 - 7.5   Color, UA Yellow Yellow   Appearance Ur Clear Clear   Leukocytes,UA 1+ (A) Negative   Protein,UA Negative Negative/Trace   Glucose, UA Negative Negative   Ketones, UA Negative Negative   RBC, UA Negative Negative   Bilirubin, UA Negative Negative   Urobilinogen, Ur 0.2 0.2 - 1.0 mg/dL   Nitrite, UA Negative Negative   Microscopic Examination See below:     Comment: Microscopic was indicated and was performed.   Urinalysis Reflex Comment     Comment: This specimen has reflexed to a Urine Culture.  Microscopic Examination     Status: None   Collection Time: 02/18/20 10:21 AM   Urine  Result Value Ref Range   WBC, UA 0-5 0 - 5 /hpf   RBC None seen 0 - 2 /hpf   Epithelial Cells (non renal) None seen 0 - 10 /hpf   Casts None seen None seen /lpf   Bacteria, UA None seen None seen/Few  Urine  Culture, Reflex     Status: None   Collection Time: 02/18/20  10:21 AM   Urine  Result Value Ref Range   Urine Culture, Routine Final report    Organism ID, Bacteria Comment     Comment: Mixed urogenital flora 10,000-25,000 colony forming units per mL     Assessment/Plan: 1. Encounter for general adult medical examination with abnormal findings Annual health maintenance exam today. Order slip given to have routine, fasting labs drawn.   2. Essential hypertension Start atenolol 25mg . Take 1/2 tablet every evening to help lower BP. Continue HCTZ as  - atenolol (TENORMIN) 25 MG tablet; Take 1/2 tablet po QPM  Dispense: 30 tablet; Refill: 1  3. Moderate recurrent major depression (HCC) Increase effexor to 225mg  daily.  - venlafaxine XR (EFFEXOR-XR) 75 MG 24 hr capsule; Take 3 capsules po QD  Dispense: 90 capsule; Refill: 1  4. Need for vaccination against Streptococcus pneumoniae using pneumococcal conjugate vaccine 13 Prescription for prevnar 13 sent to her pharmacy for administration.  - pneumococcal 13-valent conjugate vaccine (PREVNAR 13) SUSP injection; Inject 0.5 mLs into the muscle once for 1 dose.  Dispense: 0.5 mL; Refill: 0  5. Encounter for screening mammogram for malignant neoplasm of breast - MM DIGITAL SCREENING BILATERAL; Future  6. Dysuria - UA/M w/rflx Culture, Routine  General Counseling: Talasia verbalizes understanding of the findings of todays visit and agrees with plan of treatment. I have discussed any further diagnostic evaluation that may be needed or ordered today. We also reviewed her medications today. she has been encouraged to call the office with any questions or concerns that should arise related to todays visit.    Counseling:  This patient was seen by Leretha Pol FNP Collaboration with Dr Lavera Guise as a part of collaborative care agreement  Orders Placed This Encounter  Procedures   Microscopic Examination   Urine Culture, Reflex   MM DIGITAL SCREENING BILATERAL   UA/M w/rflx Culture, Routine     Meds ordered this encounter  Medications   venlafaxine XR (EFFEXOR-XR) 75 MG 24 hr capsule    Sig: Take 3 capsules po QD    Dispense:  90 capsule    Refill:  1    Changing dose to 225mg  QD    Order Specific Question:   Supervising Provider    Answer:   Lavera Guise [[id number]]   pneumococcal 13-valent conjugate vaccine (PREVNAR 13) SUSP injection    Sig: Inject 0.5 mLs into the muscle once for 1 dose.    Dispense:  0.5 mL    Refill:  0    Order Specific Question:   Supervising Provider    Answer:   Lavera Guise [[id number]]   atenolol (TENORMIN) 25 MG tablet    Sig: Take 1/2 tablet po QPM    Dispense:  30 tablet    Refill:  1    Order Specific Question:   Supervising Provider    Answer:   Lavera Guise [[id number]]    Total time spent: 18 Minutes  Time spent includes review of chart, medications, test results, and follow up plan with the patient.     Lavera Guise, MD  Internal Medicine

## 2020-02-21 LAB — UA/M W/RFLX CULTURE, ROUTINE
Bilirubin, UA: NEGATIVE
Glucose, UA: NEGATIVE
Ketones, UA: NEGATIVE
Nitrite, UA: NEGATIVE
Protein,UA: NEGATIVE
RBC, UA: NEGATIVE
Specific Gravity, UA: 1.007 (ref 1.005–1.030)
Urobilinogen, Ur: 0.2 mg/dL (ref 0.2–1.0)
pH, UA: 5.5 (ref 5.0–7.5)

## 2020-02-21 LAB — MICROSCOPIC EXAMINATION
Bacteria, UA: NONE SEEN
Casts: NONE SEEN /lpf
Epithelial Cells (non renal): NONE SEEN /hpf (ref 0–10)
RBC: NONE SEEN /hpf (ref 0–2)

## 2020-02-21 LAB — URINE CULTURE, REFLEX

## 2020-02-24 DIAGNOSIS — F331 Major depressive disorder, recurrent, moderate: Secondary | ICD-10-CM | POA: Insufficient documentation

## 2020-02-24 DIAGNOSIS — Z1231 Encounter for screening mammogram for malignant neoplasm of breast: Secondary | ICD-10-CM | POA: Insufficient documentation

## 2020-03-10 ENCOUNTER — Encounter: Payer: Self-pay | Admitting: Nurse Practitioner

## 2020-03-10 ENCOUNTER — Ambulatory Visit (INDEPENDENT_AMBULATORY_CARE_PROVIDER_SITE_OTHER): Payer: PPO | Admitting: Nurse Practitioner

## 2020-03-10 VITALS — Wt 146.0 lb

## 2020-03-10 DIAGNOSIS — R5383 Other fatigue: Secondary | ICD-10-CM

## 2020-03-10 DIAGNOSIS — F331 Major depressive disorder, recurrent, moderate: Secondary | ICD-10-CM

## 2020-03-10 NOTE — Progress Notes (Signed)
[facility] [place], [place] [address]  Internal MEDICINE  Telephone Visit  Patient Name: Bonnie Washington  [id number]  [id number]  Date of Service: 03/28/2020  I connected with the patient at 4:55pm by telephone and verified the patients identity using two identifiers.   I discussed the limitations, risks, security and privacy concerns of performing an evaluation and management service by telephone and the availability of in person appointments. I also discussed with the patient that there may be a patient responsible charge related to the service.  The patient expressed understanding and agrees to proceed.    Chief Complaint  Patient presents with  . Telephone Assessment  . Telephone Screen  . Follow-up    The patient has been contacted via telephone for follow up visit due to concerns for spread of novel coronavirus. She had been having increased levels of depression. Felt very overwhelmed. She had been having increased family stress and was feeling like she cannot deal with another thing. I increased her effexor to 225mg  daily. She states that this dose increase has helped a great deal. She is handling the stress much better. She is balancing her stress and responsibilities much more efficiently. Taking more to make her feel overwhelmed. She is crying less often. She states that she has tolerated the medication dose increate well. She reports no negative side effects from this dose increase.       Current Medication: Outpatient Encounter Medications as of 03/10/2020  Medication Sig  . aspirin EC 81 MG tablet Take 81 mg by mouth daily.  . meloxicam (MOBIC) 15 MG tablet Take 15 mg by mouth daily.  . [DISCONTINUED] atenolol (TENORMIN) 25 MG tablet Take 1/2 tablet po QPM  . [DISCONTINUED] fluticasone (FLONASE) 50 MCG/ACT nasal spray PLACE 1 SPRAY IN EACH NOSTRIL AT BEDTIME  . [DISCONTINUED] venlafaxine XR (EFFEXOR-XR) 75 MG 24 hr capsule Take 3 capsules po QD    No facility-administered encounter medications on file as of 03/10/2020.    Surgical History: Past Surgical History:  Procedure Laterality Date  . CHOLECYSTECTOMY  2014  . FRACTURE SURGERY Right 2004   elbow  . KNEE ARTHROSCOPY WITH MEDIAL MENISECTOMY Left 09/21/2018   Procedure: KNEE ARTHROSCOPY WITH MEDIAL AND LATERAL MENISECTOMY, SYNOVECTOMY, CHONDROPLASTY;  Surgeon: Lovell Sheehan, MD;  Location: [facility];  Service: Orthopedics;  Laterality: Left;  Diabetes-diet controlled  . OPEN REDUCTION INTERNAL FIXATION (ORIF) DISTAL RADIAL FRACTURE Left 10/06/2015   Procedure: OPEN REDUCTION INTERNAL FIXATION (ORIF) DISTAL RADIAL FRACTURE;  Surgeon: Earnestine Leys, MD;  Location: [facility];  Service: Orthopedics;  Laterality: Left;  Marland Kitchen VENTRAL HERNIA REPAIR N/A 06/10/2017   Procedure: HERNIA REPAIR VENTRAL ADULT;  Surgeon: Robert Bellow, MD;  Location: [facility];  Service: General;  Laterality: N/A;    Medical History: Past Medical History:  Diagnosis Date  . Depression   . Diabetes mellitus without complication ([place])    pt use to take metformin but has not taken it in 8yrs/lost weight/ stopped metformin/ watches diet  . Hypertension    controlled on meds    Family History: Family History  Problem Relation Age of Onset  . Hypertension Mother   . Breast cancer Cousin     Social History   Socioeconomic History  . Marital status: Married    Spouse name: Not on file  . Number of children: Not on file  . Years of education: Not on file  . Highest education level: Not on file  Occupational  History  . Not on file  Tobacco Use  . Smoking status: Former Smoker    Packs/day: 0.50    Years: 20.00    Pack years: 10.00    Types: Cigarettes    Quit date: 03/08/1993    Years since quitting: 27.0  . Smokeless tobacco: Never Used  Vaping Use  . Vaping Use: Never used  Substance and Sexual Activity  . Alcohol use: Yes    Alcohol/week: 7.0 standard drinks    Types: 7 Cans of  beer per week    Comment: occasionally  . Drug use: No  . Sexual activity: Not on file  Other Topics Concern  . Not on file  Social History Narrative  . Not on file   Social Determinants of Health   Financial Resource Strain: Not on file  Food Insecurity: Not on file  Transportation Needs: Not on file  Physical Activity: Not on file  Stress: Not on file  Social Connections: Not on file  Intimate Partner Violence: Not on file      Review of Systems  Constitutional: Positive for fatigue. Negative for activity change, chills and unexpected weight change.  HENT: Negative for congestion, postnasal drip, rhinorrhea, sneezing and sore throat.   Respiratory: Negative for apnea, cough, chest tightness, shortness of breath and wheezing.   Cardiovascular: Negative for chest pain and palpitations.       Elevated blood pressure today.   Gastrointestinal: Negative for abdominal pain, constipation, diarrhea, nausea and vomiting.  Endocrine: Negative for cold intolerance, heat intolerance, polydipsia and polyuria.  Genitourinary: Negative for dysuria, frequency and urgency.  Musculoskeletal: Negative for arthralgias, back pain, joint swelling and neck pain.  Skin: Negative for rash.  Allergic/Immunologic: Negative for environmental allergies.  Neurological: Negative for dizziness, tremors, numbness and headaches.  Hematological: Negative for adenopathy. Does not bruise/bleed easily.  Psychiatric/Behavioral: Positive for dysphoric mood. Negative for behavioral problems (Depression), sleep disturbance and suicidal ideas. The patient is nervous/anxious.        Improved depression/anxiety since increased dose of effexor.     Vital Signs: Wt 146 lb (66.2 kg)   BMI 25.86 kg/m    Observation/Objective:   The patient is alert and oriented. She is pleasant and answers all questions appropriately. Breathing is non-labored. She is in no acute distress at this time.    Assessment/Plan:  1.  Other fatigue Improving with improved depression. Will monitor.   2. Moderate recurrent major depression (HCC) Doing better since increased dose of Effexor. Continue at 225mg  daily. Refill as indicated.    General Counseling: Bonnie Washington verbalizes understanding of the findings of today's phone visit and agrees with plan of treatment. I have discussed any further diagnostic evaluation that may be needed or ordered today. We also reviewed her medications today. she has been encouraged to call the office with any questions or concerns that should arise related to todays visit.    Time spent: 25 Minutes    Dr 26 Internal medicine

## 2020-03-12 ENCOUNTER — Other Ambulatory Visit: Payer: Self-pay | Admitting: Nurse Practitioner

## 2020-03-12 DIAGNOSIS — F331 Major depressive disorder, recurrent, moderate: Secondary | ICD-10-CM

## 2020-03-22 ENCOUNTER — Other Ambulatory Visit: Payer: Self-pay | Admitting: Adult Health

## 2020-03-24 ENCOUNTER — Other Ambulatory Visit: Payer: Self-pay

## 2020-03-24 DIAGNOSIS — I10 Essential (primary) hypertension: Secondary | ICD-10-CM

## 2020-03-24 MED ORDER — FLUTICASONE PROPIONATE 50 MCG/ACT NA SUSP: NASAL | 11 refills | Status: DC

## 2020-03-24 MED ORDER — ATENOLOL 25 MG PO TABS: ORAL_TABLET | ORAL | 3 refills | Status: DC

## 2020-03-28 DIAGNOSIS — R5383 Other fatigue: Secondary | ICD-10-CM | POA: Insufficient documentation

## 2020-07-07 ENCOUNTER — Telehealth: Payer: Self-pay | Admitting: Nurse Practitioner

## 2020-07-07 NOTE — Progress Notes (Signed)
  Chronic Care Management   Note  07/07/2020 Name: Bonnie Washington MRN: [id number] DOB: 1945/03/21  Bonnie Washington is a 75 y.o. year old female who is a primary care patient of [name], NP. I reached out to [employer] by phone today in response to a referral sent by Bonnie Washington's PCP, [name], NP.   Bonnie Washington was given information about Chronic Care Management services today including:  1. CCM service includes personalized support from designated clinical staff supervised by her physician, including individualized plan of care and coordination with other care providers 2. 24/7 contact phone numbers for assistance for urgent and routine care needs. 3. Service will only be billed when office clinical staff spend 20 minutes or more in a month to coordinate care. 4. Only one practitioner may furnish and bill the service in a calendar month. 5. The patient may stop CCM services at any time (effective at the end of the month) by phone call to the office staff.   Patient agreed to services and verbal consent obtained.   Follow up plan:   Carley Perdue [employer] Scheduler

## 2020-07-10 ENCOUNTER — Ambulatory Visit: Payer: PPO | Admitting: Hospice and Palliative Medicine

## 2020-08-11 ENCOUNTER — Telehealth: Payer: Self-pay | Admitting: Pharmacist

## 2020-08-11 NOTE — Progress Notes (Addendum)
    Chronic Care Management Pharmacy Assistant   Name: Bonnie Washington  MRN: [id number] DOB: 1945-04-02  Bonnie Washington is an 75 y.o. year old female who presents for his initial CCM visit with the clinical pharmacist.  Reason for Encounter: Chart Prep    Conditions to be addressed/monitored: HTN, DM, Vitamin D.  Primary concerns for visit include: HTN, DM.  Recent office visits:  03/10/20 [name], NP. For Fatigue and Depression. No medication changes. 02/18/20 [name], NP. For general adult medical examination. Per note: VANVBTY 60 sent to her pharmacy. STARTED Atenolol 25 mg 1/2 tablet po QPM. CHANGED Venlafaxine to 75 mg three capsules po QD.  Recent consult visits:  None in the last six months.  Hospital visits:  None in previous 6 months   Medication History: None   Medications: Outpatient Encounter Medications as of 08/11/2020  Medication Sig   aspirin EC 81 MG tablet Take 81 mg by mouth daily.   atenolol (TENORMIN) 25 MG tablet Take 1/2 tablet po QPM   fluticasone (FLONASE) 50 MCG/ACT nasal spray PLACE 1 SPRAY IN EACH NOSTRIL AT BEDTIME   meloxicam (MOBIC) 15 MG tablet Take 15 mg by mouth daily.   venlafaxine XR (EFFEXOR-XR) 75 MG 24 hr capsule TAKE 3 CAPSULES BY MOUTH EVERY DAY   No facility-administered encounter medications on file as of 08/11/2020.    Have you seen any other providers since your last visit? Patient stated no.  Any changes in your medications or health? Patient stated no.  Any side effects from any medications? Patient stated no.  Do you have an symptoms or problems not managed by your medications? Patient stated her feet are tingling, behind her toes.   Any concerns about your health right now? Patient stated no.  Has your provider asked that you check blood pressure, blood sugar, or follow special diet at home? Patient stated no.  Do you get any type of exercise on a regular basis? Patient stated she gets in her pool 2-3 times  a week, as well as a caregiver to her husband.   Can you think of a goal you would like to reach for your health?  Patient stated no.  Do you have any problems getting your medications? Patient stated no.  Is there anything that you would like to discuss during the appointment? Patient stated no.  Please bring medications and supplements to appointment, patient reminded of her OTP appointment on 08/12/20 at 130 pm.  [place], [place] Pharmacist Assistant [phone]

## 2020-08-12 ENCOUNTER — Ambulatory Visit: Payer: PPO | Admitting: Pharmacist

## 2020-08-12 DIAGNOSIS — E119 Type 2 diabetes mellitus without complications: Secondary | ICD-10-CM

## 2020-08-12 DIAGNOSIS — I10 Essential (primary) hypertension: Secondary | ICD-10-CM

## 2020-08-12 DIAGNOSIS — F331 Major depressive disorder, recurrent, moderate: Secondary | ICD-10-CM

## 2020-08-12 NOTE — Patient Instructions (Addendum)
Visit Information  Goals Addressed            This Visit's Progress   . Track and Manage My Blood Pressure-Hypertension       Timeframe:  Long-Range Goal Priority:  High Start Date:   08/12/20                          Expected End Date:    02/11/21                   Follow Up Date 12/05/20   - check blood pressure weekly - choose a place to take my blood pressure (home, clinic or office, retail store) - write blood pressure results in a log or diary    Why is this important?    You won't feel high blood pressure, but it can still hurt your blood vessels.   High blood pressure can cause heart or kidney problems. It can also cause a stroke.   Making lifestyle changes like losing a little weight or eating less salt will help.   Checking your blood pressure at home and at different times of the day can help to control blood pressure.   If the doctor prescribes medicine remember to take it the way the doctor ordered.   Call the office if you cannot afford the medicine or if there are questions about it.     Notes:       Patient Care Plan: General Pharmacy (Adult)    Problem Identified: HTN, DM, Depression, HLD   Priority: High  Onset Date: 08/12/2020    Long-Range Goal: Patient-Specific Goal   Start Date: 08/12/2020  Expected End Date: 02/11/2021  This Visit's Progress: On track  Priority: High  Note:   Current Barriers:  . Unable to independently monitor therapeutic efficacy . Unable to achieve control of cholesterol   Pharmacist Clinical Goal(s):  Marland Kitchen Patient will achieve adherence to monitoring guidelines and medication adherence to achieve therapeutic efficacy . achieve control of cholesterol as evidenced by lipid panel . adhere to prescribed medication regimen as evidenced by fill dates . contact provider office for questions/concerns as evidenced notation of same in electronic health record through collaboration with PharmD and provider.   Interventions: . 1:1  collaboration with [name], NP regarding development and update of comprehensive plan of care as evidenced by provider attestation and co-signature . Inter-disciplinary care team collaboration (see longitudinal plan of care) . Comprehensive medication review performed; medication list updated in electronic medical record  Hypertension (BP goal <140/90) -Controlled -Current treatment: . Atenolol 25mg  one-half tablet hs -Medications previously tried: HCTZ -Current home readings: not checking at home  -Current exercise habits: pool exercise a few times per week -Denies hypotensive/hypertensive symptoms -Educated on BP goals and benefits of medications for prevention of heart attack, stroke and kidney damage; Exercise goal of 150 minutes per week; Importance of home blood pressure monitoring; Symptoms of hypotension and importance of maintaining adequate hydration; -Counseled to monitor BP at home a few times per week, document, and provide log at future appointments -Recommended to continue current medication Recommended to start monitoring at home and contact providers if BP consistently > 140/90.  Hyperlipidemia: (LDL goal < 100) -Uncontrolled -Current treatment: . None noted -Medications previously tried: Atorvastatin  -Educated on Cholesterol goals;   -Recommend repeat lipid panel -ASCVD risk currently 40.3% (high risk) - patient not currently on a statin with elevated LDL -Recommended recheck lipid panel  after upcoming physical and if still elevated ASCVD risk to start stating therapy.   Diabetes (A1c goal <7%) -Controlled -Current medications: . None -Medications previously tried: metformin  -Current home glucose readings . fasting glucose: not checking . post prandial glucose: not checking -Denies hypoglycemic/hyperglycemic symptoms  -Current exercise:  Swimming a few times per week -Educated on A1c and blood sugar goals; Complications of diabetes including  kidney damage, retinal damage, and cardiovascular disease; Exercise goal of 150 minutes per week; Counseled to check feet daily and get yearly eye exams She does mention some recent burning/numbness in feet occasionally.  Usually when she sits down in the evening. -Counseled to check feet daily and get yearly eye exams -Counseled on diet and exercise extensively Counseled on potential neuropathy caused by elevated glucose. Recommended her to schedule a physical as soon as convenient so that we can recheck A1c to determine if glucose has been elevated and this is potentially causing her neuropathy.  -Future plans would be to consider gabapentin if neuropathy worsens.  Depression/Anxiety (Goal: Minimize symptoms) -Controlled -Current treatment: . Venlafaxine ER 75mg  daily -Medications previously tried/failed: none noted -Reports mood has been pretty stable lately -Does deal with stress sometimes taking care of her husband -Educated on Benefits of medication for symptom control -Recommended to continue current medication   Patient Goals/Self-Care Activities . Patient will:  - take medications as prescribed check blood pressure a few times per week, document, and provide at future appointments target a minimum of 150 minutes of moderate intensity exercise weekly Schedule appointment for physical and updated labs.  Follow Up Plan: The care management team will reach out to the patient again over the next 90 days.       Ms. Royse was given information about Chronic Care Management services today including:  1. CCM service includes personalized support from designated clinical staff supervised by her physician, including individualized plan of care and coordination with other care providers 2. 24/7 contact phone numbers for assistance for urgent and routine care needs. 3. Standard insurance, coinsurance, copays and deductibles apply for chronic care management only during months in which  we provide at least 20 minutes of these services. Most insurances cover these services at 100%, however patients may be responsible for any copay, coinsurance and/or deductible if applicable. This service may help you avoid the need for more expensive face-to-face services. 4. Only one practitioner may furnish and bill the service in a calendar month. 5. The patient may stop CCM services at any time (effective at the end of the month) by phone call to the office staff.  Patient agreed to services and verbal consent obtained.   The patient verbalized understanding of instructions, educational materials, and care plan provided today and agreed to receive a mailed copy of patient instructions, educational materials, and care plan.  Telephone follow up appointment with pharmacy team member scheduled for: 3 months  Edythe Clarity, [facility]  How to Take Your Blood Pressure Blood pressure measures how strongly your blood is pressing against the walls of your arteries. Arteries are blood vessels that carry blood from your heart throughout your body. You can take your blood pressure at home with a machine. You may need to check your blood pressure at home:  To check if you have high blood pressure (hypertension).  To check your blood pressure over time.  To make sure your blood pressure medicine is working. Supplies needed:  Blood pressure machine, or monitor.  [place] room chair to sit  in.  Table or desk.  Small notebook.  Pencil or pen. How to prepare Avoid these things for 30 minutes before checking your blood pressure:  Having drinks with caffeine in them, such as coffee or tea.  Drinking alcohol.  Eating.  Smoking.  Exercising. Do these things five minutes before checking your blood pressure:  Go to the bathroom and pee (urinate).  Sit in a dining chair. Do not sit in a soft couch or an armchair.  Be quiet. Do not talk. How to take your blood pressure Follow the  instructions that came with your machine. If you have a digital blood pressure monitor, these may be the instructions: 1. Sit up straight. 2. Place your feet on the floor. Do not cross your ankles or legs. 3. Rest your left arm at the level of your heart. You may rest it on a table, desk, or chair. 4. Pull up your shirt sleeve. 5. Wrap the blood pressure cuff around the upper part of your left arm. The cuff should be 1 inch (2.5 cm) above your elbow. It is best to wrap the cuff around bare skin. 6. Fit the cuff snugly around your arm. You should be able to place only one finger between the cuff and your arm. 7. Place the cord so that it rests in the bend of your elbow. 8. Press the power button. 9. Sit quietly while the cuff fills with air and loses air. 10. Write down the numbers on the screen. 11. Wait 2-3 minutes and then repeat steps 1-10.   What do the numbers mean? Two numbers make up your blood pressure. The first number is called systolic pressure. The second is called diastolic pressure. An example of a blood pressure reading is "120 over 80" (or 120/80). If you are an adult and do not have a medical condition, use this guide to find out if your blood pressure is normal: Normal  First number: below 120.  Second number: below 80. Elevated  First number: 120-129.  Second number: below 80. Hypertension stage 1  First number: 130-139.  Second number: 80-89. Hypertension stage 2  First number: 140 or above.  Second number: 80 or above. Your blood pressure is above normal even if only the top or bottom number is above normal. Follow these instructions at home:  Check your blood pressure as often as your doctor tells you to.  Check your blood pressure at the same time every day.  Take your monitor to your next doctor's appointment. Your doctor will: ? Make sure you are using it correctly. ? Make sure it is working right.  Make sure you understand what your blood  pressure numbers should be.  Tell your doctor if your medicine is causing side effects.  Keep all follow-up visits as told by your doctor. This is important. General tips:  You will need a blood pressure machine, or monitor. Your doctor can suggest a monitor. You can buy one at a drugstore or online. When choosing one: ? Choose one with an arm cuff. ? Choose one that wraps around your upper arm. Only one finger should fit between your arm and the cuff. ? Do not choose one that measures your blood pressure from your wrist or finger. Where to find more information American Heart Association: www.heart.org Contact a doctor if:  Your blood pressure keeps being high. Get help right away if:  Your first blood pressure number is higher than 180.  Your second blood pressure number  is higher than 120. Summary  Check your blood pressure at the same time every day.  Avoid caffeine, alcohol, smoking, and exercise for 30 minutes before checking your blood pressure.  Make sure you understand what your blood pressure numbers should be. This information is not intended to replace advice given to you by your health care provider. Make sure you discuss any questions you have with your health care provider. Document Revised: 02/16/2019 Document Reviewed: 02/16/2019 Elsevier Patient Education  2021 [employer].

## 2020-08-12 NOTE — Progress Notes (Signed)
Chronic Care Management Pharmacy Note  08/12/2020 Name:  Bonnie Washington MRN:  [id number] DOB:  Jul 18, 1945  Summary: Initial visit with PharmD.  General medication review.  She is not monitoring BP at home and had some recent elevated BP in office which she attributes to stress.  Have asked her to monitor at home occasionally.  Also reports neuropathy in feet, last A1c is a year ago.  Have asked her to schedule a physical and get labs done as soon as convenient so we can check on A1c/lipids.  Current ASCVD risk 40.3 (high risk) - not on a statin  Recommendations/Changes made from today's visit: Schedule physical with NP - get updated A1c and lipid panel at minimum  Plan: If ASCVD risk remains elevated - start statin Check A1c determine course of action for DM - consider gabapentin for neuropathy Follow up in three months phone call   Subjective: Bonnie Washington is an 75 y.o. year old female who is a primary patient of Boscia, Greer Ee, NP.  The CCM team was consulted for assistance with disease management and care coordination needs.    Engaged with patient by telephone for initial visit in response to provider referral for pharmacy case management and/or care coordination services.   Consent to Services:  The patient was given the following information about Chronic Care Management services today, agreed to services, and gave verbal consent: 1. CCM service includes personalized support from designated clinical staff supervised by the primary care provider, including individualized plan of care and coordination with other care providers 2. 24/7 contact phone numbers for assistance for urgent and routine care needs. 3. Service will only be billed when office clinical staff spend 20 minutes or more in a month to coordinate care. 4. Only one practitioner may furnish and bill the service in a calendar month. 5.The patient may stop CCM services at any time (effective at the end of the month) by phone  call to the office staff. 6. The patient will be responsible for cost sharing (co-pay) of up to 20% of the service fee (after annual deductible is met). Patient agreed to services and consent obtained.  Patient Care Team: [name], NP as PCP - General (Family Medicine) Christie Nottingham, PA as Referring Physician (Physician Assistant) Bary Castilla Forest Gleason, MD (General Surgery) Edythe Clarity, [facility] as Pharmacist (Pharmacist)  Recent office visits:  03/10/20 [name], NP. For Fatigue and Depression. No medication changes. 02/18/20 [name], NP. For general adult medical examination. Per note: [id number] 29 sent to her pharmacy. STARTED Atenolol 25 mg 1/2 tablet po QPM. CHANGED Venlafaxine to 75 mg three capsules po QD.  Recent consult visits:  None in the last six months.  Hospital visits:  None in previous 6 months    Objective:  Lab Results  Component Value Date   CREATININE 0.65 07/10/2019   BUN 9 07/10/2019   GFRNONAA 89 07/10/2019   GFRAA 103 07/10/2019   NA 136 07/10/2019   K 4.6 07/10/2019   CALCIUM 9.4 07/10/2019   CO2 26 07/10/2019   GLUCOSE 117 (H) 07/10/2019    Lab Results  Component Value Date/Time   HGBA1C 5.9 (A) 07/10/2019 09:28 AM   HGBA1C 5.8 (A) 09/14/2018 09:20 AM   HGBA1C 6.1 (H) 12/02/2017 09:15 AM    Last diabetic Eye exam: No results found for: HMDIABEYEEXA  Last diabetic Foot exam: No results found for: HMDIABFOOTEX   Lab Results  Component Value Date  CHOL 254 (H) 12/02/2017   HDL 71 12/02/2017   LDLCALC 168 (H) 12/02/2017   TRIG 74 12/02/2017    Hepatic Function Latest Ref Rng & Units 07/10/2019 12/02/2017  Total Protein 6.0 - 8.5 g/dL 6.6 6.5  Albumin 3.7 - 4.7 g/dL 4.7 4.5  AST 0 - 40 IU/L 15 15  ALT 0 - 32 IU/L 16 18  Alk Phosphatase 39 - 117 IU/L 87 73  Total Bilirubin 0.0 - 1.2 mg/dL 0.4 0.5    Lab Results  Component Value Date/Time   TSH 3.590 12/02/2017 09:15 AM   FREET4 1.12 12/02/2017 09:15 AM     CBC Latest Ref Rng & Units 07/10/2019 09/18/2018 12/02/2017  WBC 3.4 - 10.8 x10E3/uL 5.7 4.5 4.9  Hemoglobin 11.1 - 15.9 g/dL 13.6 13.3 13.4  Hematocrit 34.0 - 46.6 % 39.8 39.2 39.4  Platelets 150 - 450 x10E3/uL 276 268 304    Lab Results  Component Value Date/Time   VD25OH 32.2 07/10/2019 10:21 AM   VD25OH 22.2 (L) 12/02/2017 09:15 AM    Clinical ASCVD: No  The 10-year ASCVD risk score Mikey Bussing DC Jr., et al., 2013) is: 40.3%   Values used to calculate the score:     Age: 24 years     Sex: Female     Is Non-Hispanic African American: No     Diabetic: Yes     Tobacco smoker: No     Systolic Blood Pressure: 681 mmHg     Is BP treated: Yes     HDL Cholesterol: 71 mg/dL     Total Cholesterol: 254 mg/dL    Depression screen [facility] 2/9 03/10/2020 02/18/2020 11/13/2019  Decreased Interest 0 0 0  Down, Depressed, Hopeless 0 0 0  PHQ - 2 Score 0 0 0      Social History   Tobacco Use  Smoking Status Former Smoker  . Packs/day: 0.50  . Years: 20.00  . Pack years: 10.00  . Types: Cigarettes  . Quit date: 03/08/1993  . Years since quitting: 27.4  Smokeless Tobacco Never Used   BP Readings from Last 3 Encounters:  02/18/20 (!) 150/80  11/13/19 (!) 152/74  07/10/19 140/80   Pulse Readings from Last 3 Encounters:  02/18/20 75  11/13/19 77  07/10/19 73   Wt Readings from Last 3 Encounters:  03/10/20 146 lb (66.2 kg)  02/18/20 147 lb 9.6 oz (67 kg)  11/13/19 146 lb 12.8 oz (66.6 kg)   BMI Readings from Last 3 Encounters:  03/10/20 25.86 kg/m  02/18/20 26.15 kg/m  11/13/19 25.20 kg/m    Assessment/Interventions: Review of patient past medical history, allergies, medications, health status, including review of consultants reports, laboratory and other test data, was performed as part of comprehensive evaluation and provision of chronic care management services.   SDOH:  (Social Determinants of Health) assessments and interventions performed: Yes   Financial Resource  Strain: Low Risk   . Difficulty of Paying Living Expenses: Not very hard    Financial Resource Strain: Low Risk   . Difficulty of Paying Living Expenses: Not very hard    SDOH Screenings   Alcohol Screen: Low Risk   . Last Alcohol Screening Score (AUDIT): 4  Depression (PHQ2-9): Low Risk   . PHQ-2 Score: 0  Financial Resource Strain: Low Risk   . Difficulty of Paying Living Expenses: Not very hard  Food Insecurity: Not on file  Housing: Not on file  Physical Activity: Not on file  Social Connections: Not on  file  Stress: Not on file  Tobacco Use: Medium Risk  . Smoking Tobacco Use: Former Smoker  . Smokeless Tobacco Use: Never Used  Transportation Needs: Not on file    CCM Care Plan  Allergies  Allergen Reactions  . Codeine Itching    Okay if takes benadryl along with it    Medications Reviewed Today    Reviewed by Edythe Clarity, [facility] (Pharmacist) on 08/12/20 at [place] List Status: <None>  Medication Order Taking? Sig Documenting Provider Last Dose Status Informant  aspirin EC 81 MG tablet [id number] Yes Take 81 mg by mouth daily. [provider] Taking Active Self  atenolol (TENORMIN) 25 MG tablet [id number] Yes Take 1/2 tablet po QPM Lavera Guise, MD Taking Active   fluticasone [facility]) 50 MCG/ACT nasal spray [id number] Yes PLACE 1 SPRAY IN EACH NOSTRIL AT BEDTIME Lavera Guise, MD Taking Active   meloxicam [facility]) 15 MG tablet [id number] Yes Take 15 mg by mouth daily. [provider] Taking Active   venlafaxine XR (EFFEXOR-XR) 75 MG 24 hr capsule [id number] Yes TAKE 3 CAPSULES BY MOUTH EVERY DAY Lavera Guise, MD Taking Active           Patient Active Problem List   Diagnosis Date Noted  . Other fatigue 03/28/2020  . Moderate recurrent major depression ([place]) 02/24/2020  . Encounter for screening mammogram for malignant neoplasm of breast 02/24/2020  . Hematoma of right parietal scalp 11/13/2019  . Fall at home, initial encounter  11/13/2019  . Flu vaccine need 11/13/2019  . Encounter for general adult medical examination with abnormal findings 02/13/2019  . Need for vaccination against Streptococcus pneumoniae using pneumococcal conjugate vaccine 13 02/13/2019  . Tear of medial meniscus of left knee, current 09/14/2018  . Left groin pain 04/23/2018  . Fever and chills 03/23/2018  . Flu-like symptoms 03/16/2018  . Acute upper respiratory infection 03/16/2018  . Cough 03/16/2018  . Encounter for preoperative examination for general surgical procedure 10/24/2017  . Impaired fasting glucose 10/24/2017  . Vitamin D deficiency 10/24/2017  . Dysuria 10/24/2017  . Ventral hernia without obstruction or gangrene 05/18/2017  . Essential hypertension 04/15/2017  . Diabetes mellitus without complication ([place]) 23/53/6144    Immunization History  Administered Date(s) Administered  . DTaP 05/07/2015  . Influenza Inj Mdck Quad Pf 11/13/2019  . Influenza, High Dose Seasonal PF 12/18/2016, 01/24/2018, 01/02/2019  . Influenza-Unspecified 12/27/2016  . Pneumococcal Conjugate-13 02/18/2020  . Zoster, Live 05/07/2015    Conditions to be addressed/monitored:  HTN, DM, Depression  Care Plan : General Pharmacy (Adult)  Updates made by Edythe Clarity, RPH since 08/12/2020 12:00 AM    Problem: HTN, DM, Depression, HLD   Priority: High  Onset Date: 08/12/2020    Long-Range Goal: Patient-Specific Goal   Start Date: 08/12/2020  Expected End Date: 02/11/2021  This Visit's Progress: On track  Priority: High  Note:   Current Barriers:  . Unable to independently monitor therapeutic efficacy . Unable to achieve control of cholesterol   Pharmacist Clinical Goal(s):  Marland Kitchen Patient will achieve adherence to monitoring guidelines and medication adherence to achieve therapeutic efficacy . achieve control of cholesterol as evidenced by lipid panel . adhere to prescribed medication regimen as evidenced by fill dates . contact provider  office for questions/concerns as evidenced notation of same in electronic health record through collaboration with PharmD and provider.   Interventions: . 1:1 collaboration with [name], NP regarding development and update of comprehensive  plan of care as evidenced by provider attestation and co-signature . Inter-disciplinary care team collaboration (see longitudinal plan of care) . Comprehensive medication review performed; medication list updated in electronic medical record  Hypertension (BP goal <140/90) -Controlled -Current treatment: . Atenolol 39m one-half tablet hs -Medications previously tried: HCTZ -Current home readings: not checking at home  -Current exercise habits: pool exercise a few times per week -Denies hypotensive/hypertensive symptoms -Educated on BP goals and benefits of medications for prevention of heart attack, stroke and kidney damage; Exercise goal of 150 minutes per week; Importance of home blood pressure monitoring; Symptoms of hypotension and importance of maintaining adequate hydration; -Counseled to monitor BP at home a few times per week, document, and provide log at future appointments -Recommended to continue current medication Recommended to start monitoring at home and contact providers if BP consistently > 140/90.  Hyperlipidemia: (LDL goal < 100) -Uncontrolled -Current treatment: . None noted -Medications previously tried: Atorvastatin  -Educated on Cholesterol goals;   -Recommend repeat lipid panel -ASCVD risk currently 40.3% (high risk) - patient not currently on a statin with elevated LDL -Recommended recheck lipid panel after upcoming physical and if still elevated ASCVD risk to start stating therapy.   Diabetes (A1c goal <7%) -Controlled -Current medications: . None -Medications previously tried: metformin  -Current home glucose readings . fasting glucose: not checking . post prandial glucose: not checking -Denies  hypoglycemic/hyperglycemic symptoms  -Current exercise:  Swimming a few times per week -Educated on A1c and blood sugar goals; Complications of diabetes including kidney damage, retinal damage, and cardiovascular disease; Exercise goal of 150 minutes per week; Counseled to check feet daily and get yearly eye exams She does mention some recent burning/numbness in feet occasionally.  Usually when she sits down in the evening. -Counseled to check feet daily and get yearly eye exams -Counseled on diet and exercise extensively Counseled on potential neuropathy caused by elevated glucose. Recommended her to schedule a physical as soon as convenient so that we can recheck A1c to determine if glucose has been elevated and this is potentially causing her neuropathy.  -Future plans would be to consider gabapentin if neuropathy worsens.  Depression/Anxiety (Goal: Minimize symptoms) -Controlled -Current treatment: . Venlafaxine ER 770mdaily -Medications previously tried/failed: none noted -Reports mood has been pretty stable lately -Does deal with stress sometimes taking care of her husband -Educated on Benefits of medication for symptom control -Recommended to continue current medication   Patient Goals/Self-Care Activities . Patient will:  - take medications as prescribed check blood pressure a few times per week, document, and provide at future appointments target a minimum of 150 minutes of moderate intensity exercise weekly Schedule appointment for physical and updated labs.  Follow Up Plan: The care management team will reach out to the patient again over the next 90 days.        Medication Assistance: None required.  Patient affirms current coverage meets needs.  Compliance/Adherence/Medication fill history: Care Gaps: Needs mammogram AWV  Star-Rating Drugs: None  Patient's preferred pharmacy is:  [id number] [place] [id number]: [phone]: [phone] pill box? No - takes out of vials Pt endorses 100% compliance  We discussed: Benefits of medication synchronization, packaging and delivery as well as enhanced pharmacist oversight with [employer]. Patient decided to: Continue current medication management strategy  Care Plan and Follow Up Patient Decision:  Patient agrees to Care Plan and Follow-up.  Plan: The care management team will reach out to the patient again over the next 90 days.  Beverly Milch, PharmD Clinical Pharmacist [facility] [phone]

## 2020-08-27 ENCOUNTER — Telehealth: Payer: Self-pay

## 2020-08-27 NOTE — Telephone Encounter (Signed)
Lmom  for patient to call our office to reschedule cancelled appointment from May 2022. Bonnie Washington

## 2020-09-04 DIAGNOSIS — E119 Type 2 diabetes mellitus without complications: Secondary | ICD-10-CM | POA: Diagnosis not present

## 2020-09-04 DIAGNOSIS — I10 Essential (primary) hypertension: Secondary | ICD-10-CM | POA: Diagnosis not present

## 2020-09-19 ENCOUNTER — Other Ambulatory Visit: Payer: Self-pay | Admitting: Internal Medicine

## 2020-09-19 DIAGNOSIS — F331 Major depressive disorder, recurrent, moderate: Secondary | ICD-10-CM

## 2020-09-23 ENCOUNTER — Telehealth: Payer: Self-pay | Admitting: Pharmacist

## 2020-09-23 NOTE — Progress Notes (Addendum)
    Chronic Care Management Pharmacy Assistant   Name: Bonnie Washington  MRN: [id number] DOB: 1945-07-20  Reason for Encounter: General Disease State Call   Conditions to be addressed/monitored: HTN, DM, Depression  Recent office visits:  None since 08/12/20  Recent consult visits:  None since 08/12/20  Hospital visits:  None since 08/12/20  Medications: Outpatient Encounter Medications as of 09/23/2020  Medication Sig   aspirin EC 81 MG tablet Take 81 mg by mouth daily.   atenolol (TENORMIN) 25 MG tablet Take 1/2 tablet po QPM   fluticasone (FLONASE) 50 MCG/ACT nasal spray PLACE 1 SPRAY IN EACH NOSTRIL AT BEDTIME   meloxicam (MOBIC) 15 MG tablet Take 15 mg by mouth daily.   venlafaxine XR (EFFEXOR-XR) 75 MG 24 hr capsule TAKE 3 CAPSULES BY MOUTH EVERY DAY   No facility-administered encounter medications on file as of 09/23/2020.   GEN CALL: Patient stated she is doing fine as of now. 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 She stated her husband has to have surgery soon so after she gets him through his medical needs she will do surly call and make appointment for herself.   Star Rating Drugs: N/A.  Follow-Up:Pharmacist Review  Charlann Lange, RMA Clinical Pharmacist Assistant [phone]   5 minutes spent in review, coordination, and documentation.  Reviewed by: Beverly Milch, PharmD Clinical Pharmacist [phone]

## 2020-10-05 DIAGNOSIS — I10 Essential (primary) hypertension: Secondary | ICD-10-CM | POA: Diagnosis not present

## 2020-10-05 DIAGNOSIS — E119 Type 2 diabetes mellitus without complications: Secondary | ICD-10-CM | POA: Diagnosis not present

## 2020-10-10 ENCOUNTER — Encounter: Payer: Self-pay | Admitting: Internal Medicine

## 2020-10-10 ENCOUNTER — Other Ambulatory Visit: Payer: Self-pay

## 2020-10-15 ENCOUNTER — Other Ambulatory Visit: Payer: Self-pay | Admitting: Internal Medicine

## 2020-10-15 DIAGNOSIS — F331 Major depressive disorder, recurrent, moderate: Secondary | ICD-10-CM

## 2020-10-29 ENCOUNTER — Other Ambulatory Visit: Payer: Self-pay | Admitting: Internal Medicine

## 2020-10-29 DIAGNOSIS — F331 Major depressive disorder, recurrent, moderate: Secondary | ICD-10-CM

## 2020-10-29 NOTE — Telephone Encounter (Signed)
We can fill 90 days on her next appt

## 2020-11-12 ENCOUNTER — Ambulatory Visit: Payer: PPO | Admitting: Nurse Practitioner

## 2020-11-13 NOTE — Progress Notes (Signed)
Chronic Care Management Pharmacy Note  11/14/2020 Name:  Bonnie Washington MRN:  [id number] DOB:  [birth year]  Summary: FU visit with PharmD.  Physical scheduled for next week.  Current ASCVD risk 40.3 (high risk) - not on a statin.  BP is up and down.  Recommendations/Changes made from today's visit: Check lipids - initiate statin if LDL elevated Patient also needs AWV for medicare  Plan: If ASCVD risk remains elevated - start statin Check A1c determine course of action for DM - consider gabapentin for neuropathy Follow up in four months phone call   Subjective: Bonnie Washington is an 75 y.o. year old female who is a primary patient of Boscia, Greer Ee, NP.  The CCM team was consulted for assistance with disease management and care coordination needs.    Engaged with patient by telephone for follow up visit in response to provider referral for pharmacy case management and/or care coordination services.   Consent to Services:  The patient was given the following information about Chronic Care Management services today, agreed to services, and gave verbal consent: 1. CCM service includes personalized support from designated clinical staff supervised by the primary care provider, including individualized plan of care and coordination with other care providers 2. 24/7 contact phone numbers for assistance for urgent and routine care needs. 3. Service will only be billed when office clinical staff spend 20 minutes or more in a month to coordinate care. 4. Only one practitioner may furnish and bill the service in a calendar month. 5.The patient may stop CCM services at any time (effective at the end of the month) by phone call to the office staff. 6. The patient will be responsible for cost sharing (co-pay) of up to 20% of the service fee (after annual deductible is met). Patient agreed to services and consent obtained.  Patient Care Team: [name], NP as PCP - General (Family  Medicine) Christie Nottingham, PA as Referring Physician (Physician Assistant) Bary Castilla Forest Gleason, MD (General Surgery) Edythe Clarity, [facility] as Pharmacist (Pharmacist)  Recent office visits:  03/10/20 [name], NP. For Fatigue and Depression. No medication changes. 02/18/20 [name], NP. For general adult medical examination. Per note: FAOZHYQ 65 sent to her pharmacy. STARTED Atenolol 25 mg 1/2 tablet po QPM. CHANGED Venlafaxine to 75 mg three capsules po QD.   Recent consult visits:  None in the last six months.   Hospital visits:  None in previous 6 months    Objective:  Lab Results  Component Value Date   CREATININE 0.65 07/10/2019   BUN 9 07/10/2019   GFRNONAA 89 07/10/2019   GFRAA 103 07/10/2019   NA 136 07/10/2019   K 4.6 07/10/2019   CALCIUM 9.4 07/10/2019   CO2 26 07/10/2019   GLUCOSE 117 (H) 07/10/2019    Lab Results  Component Value Date/Time   HGBA1C 5.9 (A) 07/10/2019 09:28 AM   HGBA1C 5.8 (A) 09/14/2018 09:20 AM   HGBA1C 6.1 (H) 12/02/2017 09:15 AM    Last diabetic Eye exam: No results found for: HMDIABEYEEXA  Last diabetic Foot exam: No results found for: HMDIABFOOTEX   Lab Results  Component Value Date   CHOL 254 (H) 12/02/2017   HDL 71 12/02/2017   LDLCALC 168 (H) 12/02/2017   TRIG 74 12/02/2017    Hepatic Function Latest Ref Rng & Units 07/10/2019 12/02/2017  Total Protein 6.0 - 8.5 g/dL 6.6 6.5  Albumin 3.7 - 4.7 g/dL 4.7 4.5  AST  0 - 40 IU/L 15 15  ALT 0 - 32 IU/L 16 18  Alk Phosphatase 39 - 117 IU/L 87 73  Total Bilirubin 0.0 - 1.2 mg/dL 0.4 0.5    Lab Results  Component Value Date/Time   TSH 3.590 12/02/2017 09:15 AM   FREET4 1.12 12/02/2017 09:15 AM    CBC Latest Ref Rng & Units 07/10/2019 09/18/2018 12/02/2017  WBC 3.4 - 10.8 x10E3/uL 5.7 4.5 4.9  Hemoglobin 11.1 - 15.9 g/dL 13.6 13.3 13.4  Hematocrit 34.0 - 46.6 % 39.8 39.2 39.4  Platelets 150 - 450 x10E3/uL 276 268 304    Lab Results  Component Value Date/Time    VD25OH 32.2 07/10/2019 10:21 AM   VD25OH 22.2 (L) 12/02/2017 09:15 AM    Clinical ASCVD: No  The 10-year ASCVD risk score (Arnett DK, et al., 2019) is: 40.3%   Values used to calculate the score:     Age: 41 years     Sex: Female     Is Non-Hispanic African American: No     Diabetic: Yes     Tobacco smoker: No     Systolic Blood Pressure: 458 mmHg     Is BP treated: Yes     HDL Cholesterol: 71 mg/dL     Total Cholesterol: 254 mg/dL    Depression screen [facility] 2/9 03/10/2020 02/18/2020 11/13/2019  Decreased Interest 0 0 0  Down, Depressed, Hopeless 0 0 0  PHQ - 2 Score 0 0 0      Social History   Tobacco Use  Smoking Status Former   Packs/day: 0.50   Years: 20.00   Pack years: 10.00   Types: Cigarettes   Quit date: 03/08/1993   Years since quitting: 27.7  Smokeless Tobacco Never   BP Readings from Last 3 Encounters:  02/18/20 (!) 150/80  11/13/19 (!) 152/74  07/10/19 140/80   Pulse Readings from Last 3 Encounters:  02/18/20 75  11/13/19 77  07/10/19 73   Wt Readings from Last 3 Encounters:  03/10/20 146 lb (66.2 kg)  02/18/20 147 lb 9.6 oz (67 kg)  11/13/19 146 lb 12.8 oz (66.6 kg)   BMI Readings from Last 3 Encounters:  03/10/20 25.86 kg/m  02/18/20 26.15 kg/m  11/13/19 25.20 kg/m    Assessment/Interventions: Review of patient past medical history, allergies, medications, health status, including review of consultants reports, laboratory and other test data, was performed as part of comprehensive evaluation and provision of chronic care management services.   SDOH:  (Social Determinants of Health) assessments and interventions performed: Yes   Financial Resource Strain: Low Risk    Difficulty of Paying Living Expenses: Not very hard    Emergency planning/management officer Strain: Low Risk    Difficulty of Paying Living Expenses: Not very hard    SDOH Screenings   Alcohol Screen: Low Risk    Last Alcohol Screening Score (AUDIT): 4  Depression (PHQ2-9): Low Risk     PHQ-2 Score: 0  Financial Resource Strain: Low Risk    Difficulty of Paying Living Expenses: Not very hard  Food Insecurity: Not on file  Housing: Not on file  Physical Activity: Not on file  Social Connections: Not on file  Stress: Not on file  Tobacco Use: Medium Risk   Smoking Tobacco Use: Former   Smokeless Tobacco Use: Never  Transportation Needs: Not on file    [place]  Allergies  Allergen Reactions   Codeine Itching    Okay if takes benadryl along with it  Medications Reviewed Today     Reviewed by Edythe Clarity, [facility] (Pharmacist) on 11/14/20 at 1047  Med List Status: <None>   Medication Order Taking? Sig Documenting Provider Last Dose Status Informant  aspirin EC 81 MG tablet [id number] Yes Take 81 mg by mouth daily. [provider] Taking Active Self  atenolol (TENORMIN) 25 MG tablet [id number] Yes Take 1/2 tablet po QPM Lavera Guise, MD Taking Active   fluticasone [facility]) 50 MCG/ACT nasal spray [id number] Yes PLACE 1 SPRAY IN EACH NOSTRIL AT BEDTIME Lavera Guise, MD Taking Active   Influenza Vac High-Dose Quad (FLUZONE HIGH-DOSE QUADRIVALENT) 0.7 ML SUSY [id number] Yes Fluzone High-Dose Quad 2020-21 (PF) 240 mcg/0.7 mL IM syringe [provider] Taking Active   meloxicam (MOBIC) 15 MG tablet [id number] Yes Take 15 mg by mouth daily. [provider] Taking Active   venlafaxine XR (EFFEXOR-XR) 75 MG 24 hr capsule [id number] Yes TAKE 3 CAPSULES BY MOUTH EVERY DAY Lavera Guise, MD Taking Active             Patient Active Problem List   Diagnosis Date Noted   Other fatigue 03/28/2020   Moderate recurrent major depression ([place]) 02/24/2020   Encounter for screening mammogram for malignant neoplasm of breast 02/24/2020   Hematoma of right parietal scalp 11/13/2019   Fall at home, initial encounter 11/13/2019   Flu vaccine need 11/13/2019   Encounter for general adult medical examination with abnormal findings 02/13/2019   Need  for vaccination against Streptococcus pneumoniae using pneumococcal conjugate vaccine 13 02/13/2019   Tear of medial meniscus of left knee, current 09/14/2018   Left groin pain 04/23/2018   Fever and chills 03/23/2018   Flu-like symptoms 03/16/2018   Acute upper respiratory infection 03/16/2018   Cough 03/16/2018   Encounter for preoperative examination for general surgical procedure 10/24/2017   Impaired fasting glucose 10/24/2017   Vitamin D deficiency 10/24/2017   Dysuria 10/24/2017   Ventral hernia without obstruction or gangrene 05/18/2017   Essential hypertension 04/15/2017   Diabetes mellitus without complication ([place]) 74/25/9563    Immunization History  Administered Date(s) Administered   DTaP 05/07/2015   Influenza Inj Mdck Quad Pf 11/13/2019   Influenza, High Dose Seasonal PF 12/18/2016, 01/24/2018, 01/02/2019   Influenza-Unspecified 12/27/2016   Pneumococcal Conjugate-13 02/18/2020   Zoster, Live 05/07/2015    Conditions to be addressed/monitored:  HTN, DM, Depression  Care Plan : General Pharmacy (Adult)  Updates made by Edythe Clarity, RPH since 11/14/2020 12:00 AM     Problem: HTN, DM, Depression, HLD   Priority: High  Onset Date: 08/12/2020     Long-Range Goal: Patient-Specific Goal   Start Date: 08/12/2020  Expected End Date: 02/11/2021  Recent Progress: On track  Priority: High  Note:   Current Barriers:  Unable to independently monitor therapeutic efficacy Unable to achieve control of cholesterol   Pharmacist Clinical Goal(s):  Patient will achieve adherence to monitoring guidelines and medication adherence to achieve therapeutic efficacy achieve control of cholesterol as evidenced by lipid panel adhere to prescribed medication regimen as evidenced by fill dates contact provider office for questions/concerns as evidenced notation of same in electronic health record through collaboration with PharmD and provider.   Interventions: 1:1 collaboration  with [name], NP regarding development and update of comprehensive plan of care as evidenced by provider attestation and co-signature Inter-disciplinary care team collaboration (see longitudinal plan of care) Comprehensive medication review performed; medication list updated in electronic medical record  Hypertension (  BP goal <140/90) -Controlled -Current treatment: Atenolol 63m one-half tablet hs -Medications previously tried: HCTZ -Current home readings: not checking at home -Current exercise habits: pool exercise a few times per week -Denies hypotensive/hypertensive symptoms -Educated on BP goals and benefits of medications for prevention of heart attack, stroke and kidney damage; Exercise goal of 150 minutes per week; Importance of home blood pressure monitoring; Symptoms of hypotension and importance of maintaining adequate hydration; -Counseled to monitor BP at home a few times per week, document, and provide log at future appointments -Recommended to continue current medication Recommended to start monitoring at home and contact providers if BP consistently > 140/90.  Update 11/14/20 BP at home: 124/78 11/11/20 Denies any dizziness, HA She is now checking BP sporadically which is improvement Recommended continue current meds for now. Contact [place]if BP > 140/90 consistently  Hyperlipidemia: (LDL goal < 100) -Uncontrolled -Current treatment: None noted -Medications previously tried: Atorvastatin  -Educated on Cholesterol goals;   -Recommend repeat lipid panel -ASCVD risk currently 40.3% (high risk) - patient not currently on a statin with elevated LDL -Recommended recheck lipid panel after upcoming physical and if still elevated ASCVD risk to start stating therapy.  Update 11/14/20 Has labs upcoming next week. Based on ASCVD and LDL would recommend initiation of statin if LDL remains elevated.  Will FU on labs next month.  Diabetes (A1c goal  <7%) -Controlled -Current medications: None -Medications previously tried: metformin  -Current home glucose readings fasting glucose: not checking post prandial glucose: not checking -Denies hypoglycemic/hyperglycemic symptoms -Current exercise:  Swimming a few times per week -Educated on A1c and blood sugar goals; Complications of diabetes including kidney damage, retinal damage, and cardiovascular disease; Exercise goal of 150 minutes per week; Counseled to check feet daily and get yearly eye exams She does mention some recent burning/numbness in feet occasionally.  Usually when she sits down in the evening. -Counseled to check feet daily and get yearly eye exams -Counseled on diet and exercise extensively Counseled on potential neuropathy caused by elevated glucose. Recommended her to schedule a physical as soon as convenient so that we can recheck A1c to determine if glucose has been elevated and this is potentially causing her neuropathy.  -Future plans would be to consider gabapentin if neuropathy worsens.  Depression/Anxiety (Goal: Minimize symptoms) -Controlled -Current treatment: Venlafaxine ER 766mdaily -Medications previously tried/failed: none noted -Reports mood has been pretty stable lately -Does deal with stress sometimes taking care of her husband -Educated on Benefits of medication for symptom control -Recommended to continue current medication   Patient Goals/Self-Care Activities Patient will:  - take medications as prescribed check blood pressure a few times per week, document, and provide at future appointments target a minimum of 150 minutes of moderate intensity exercise weekly Schedule appointment for physical and updated labs.  Follow Up Plan: The care management team will reach out to the patient again over the next 90 days.            Medication Assistance: None required.  Patient affirms current coverage meets  needs.  Compliance/Adherence/Medication fill history: Care Gaps: Needs mammogram AWV  Star-Rating Drugs: None  Patient's preferred pharmacy is:  [id number] [place] [id number]: [phone]: [phone] pill box? No - takes out of vials Pt endorses 100% compliance  We discussed: Verbal consent obtained for [facility] enhanced pharmacy services (medication synchronization, adherence packaging, delivery coordination). A medication  sync plan was created to allow patient to get all medications delivered once every 30 to 90 days per patient preference. Patient understands they have freedom to choose pharmacy and clinical pharmacist will coordinate care between all prescribers and [facility]. Patient decided to: Utilize [facility] for medication synchronization, packaging and delivery  Care Plan and Follow Up Patient Decision:  Patient agrees to Care Plan and Follow-up.  Plan: The care management team will reach out to the patient again over the next 90 days.  Beverly Milch, PharmD Clinical Pharmacist [facility] [phone]

## 2020-11-14 ENCOUNTER — Ambulatory Visit: Payer: PPO | Admitting: Pharmacist

## 2020-11-14 ENCOUNTER — Other Ambulatory Visit: Payer: Self-pay

## 2020-11-14 DIAGNOSIS — E119 Type 2 diabetes mellitus without complications: Secondary | ICD-10-CM

## 2020-11-14 DIAGNOSIS — I10 Essential (primary) hypertension: Secondary | ICD-10-CM

## 2020-11-14 NOTE — Patient Instructions (Addendum)
Visit Information   Goals Addressed   None    Patient Care Plan: General Pharmacy (Adult)     Problem Identified: HTN, DM, Depression, HLD   Priority: High  Onset Date: 08/12/2020     Long-Range Goal: Patient-Specific Goal   Start Date: 08/12/2020  Expected End Date: 02/11/2021  Recent Progress: On track  Priority: High  Note:   Current Barriers:  Unable to independently monitor therapeutic efficacy Unable to achieve control of cholesterol   Pharmacist Clinical Goal(s):  Patient will achieve adherence to monitoring guidelines and medication adherence to achieve therapeutic efficacy achieve control of cholesterol as evidenced by lipid panel adhere to prescribed medication regimen as evidenced by fill dates contact provider office for questions/concerns as evidenced notation of same in electronic health record through collaboration with PharmD and provider.   Interventions: 1:1 collaboration with [name], NP regarding development and update of comprehensive plan of care as evidenced by provider attestation and co-signature Inter-disciplinary care team collaboration (see longitudinal plan of care) Comprehensive medication review performed; medication list updated in electronic medical record  Hypertension (BP goal <140/90) -Controlled -Current treatment: Atenolol '25mg'$  one-half tablet hs -Medications previously tried: HCTZ -Current home readings: not checking at home -Current exercise habits: pool exercise a few times per week -Denies hypotensive/hypertensive symptoms -Educated on BP goals and benefits of medications for prevention of heart attack, stroke and kidney damage; Exercise goal of 150 minutes per week; Importance of home blood pressure monitoring; Symptoms of hypotension and importance of maintaining adequate hydration; -Counseled to monitor BP at home a few times per week, document, and provide log at future appointments -Recommended to continue current  medication Recommended to start monitoring at home and contact providers if BP consistently > 140/90.  Update 11/14/20 BP at home: 124/78 11/11/20 Denies any dizziness, HA She is now checking BP sporadically which is improvement Recommended continue current meds for now. Contact us if BP > 140/90 consistently  Hyperlipidemia: (LDL goal < 100) -Uncontrolled -Current treatment: None noted -Medications previously tried: Atorvastatin  -Educated on Cholesterol goals;   -Recommend repeat lipid panel -ASCVD risk currently 40.3% (high risk) - patient not currently on a statin with elevated LDL -Recommended recheck lipid panel after upcoming physical and if still elevated ASCVD risk to start stating therapy.  Update 11/14/20 Has labs upcoming next week. Based on ASCVD and LDL would recommend initiation of statin if LDL remains elevated.  Will FU on labs next month.  Diabetes (A1c goal <7%) -Controlled -Current medications: None -Medications previously tried: metformin  -Current home glucose readings fasting glucose: not checking post prandial glucose: not checking -Denies hypoglycemic/hyperglycemic symptoms -Current exercise:  Swimming a few times per week -Educated on A1c and blood sugar goals; Complications of diabetes including kidney damage, retinal damage, and cardiovascular disease; Exercise goal of 150 minutes per week; Counseled to check feet daily and get yearly eye exams She does mention some recent burning/numbness in feet occasionally.  Usually when she sits down in the evening. -Counseled to check feet daily and get yearly eye exams -Counseled on diet and exercise extensively Counseled on potential neuropathy caused by elevated glucose. Recommended her to schedule a physical as soon as convenient so that we can recheck A1c to determine if glucose has been elevated and this is potentially causing her neuropathy.  -Future plans would be to consider gabapentin if  neuropathy worsens.  Depression/Anxiety (Goal: Minimize symptoms) -Controlled -Current treatment: Venlafaxine ER '75mg'$  daily -Medications previously tried/failed: none noted -Reports mood has  been pretty stable lately -Does deal with stress sometimes taking care of her husband -Educated on Benefits of medication for symptom control -Recommended to continue current medication   Patient Goals/Self-Care Activities Patient will:  - take medications as prescribed check blood pressure a few times per week, document, and provide at future appointments target a minimum of 150 minutes of moderate intensity exercise weekly Schedule appointment for physical and updated labs.  Follow Up Plan: The care management team will reach out to the patient again over the next 90 days.           Patient verbalizes understanding of instructions provided today and agrees to view in [place].  Telephone follow up appointment with pharmacy team member scheduled for: 4 months  Edythe Clarity, [place]

## 2020-11-19 ENCOUNTER — Encounter: Payer: Self-pay | Admitting: Nurse Practitioner

## 2020-11-19 ENCOUNTER — Ambulatory Visit (INDEPENDENT_AMBULATORY_CARE_PROVIDER_SITE_OTHER): Payer: PPO | Admitting: Nurse Practitioner

## 2020-11-19 ENCOUNTER — Other Ambulatory Visit: Payer: Self-pay

## 2020-11-19 VITALS — BP 140/70 | HR 65 | Temp 98.8°F | Resp 16 | Ht 64.0 in | Wt 146.8 lb

## 2020-11-19 DIAGNOSIS — E559 Vitamin D deficiency, unspecified: Secondary | ICD-10-CM | POA: Diagnosis not present

## 2020-11-19 DIAGNOSIS — Z23 Encounter for immunization: Secondary | ICD-10-CM

## 2020-11-19 DIAGNOSIS — Z1231 Encounter for screening mammogram for malignant neoplasm of breast: Secondary | ICD-10-CM | POA: Diagnosis not present

## 2020-11-19 DIAGNOSIS — E119 Type 2 diabetes mellitus without complications: Secondary | ICD-10-CM

## 2020-11-19 LAB — POCT GLYCOSYLATED HEMOGLOBIN (HGB A1C): Hemoglobin A1C: 5.8 % — AB (ref 4.0–5.6)

## 2020-11-19 MED ORDER — ZOSTER VAC RECOMB ADJUVANTED 50 MCG/0.5ML IM SUSR: 0.5000 mL | Freq: Once | INTRAMUSCULAR | 0 refills | Status: AC

## 2020-11-19 NOTE — Progress Notes (Signed)
[facility] [place], [place] [address]  Internal MEDICINE  Office Visit Note  Patient Name: Bonnie Washington  [id number]  [id number]  Date of Service: 11/19/2020  Chief Complaint  Patient presents with   Follow-up    HPI Bonnie Washington presents for a routine follow up visit. She has no specific concerns or questions. She denies any pain. No refills needed today. She has her annual wellness visit in 3 months and is requesting to have labs ordered prior to the visit. She is due for a mammogram and shingles vaccine. She is also requesting her flu vaccine.  She takes atenolol for hypertension. Blood pressure is controlled.  A1C is 5.8 today and has been consistent for the past 3 years between 5.8-6.1.    Current Medication: Outpatient Encounter Medications as of 11/19/2020  Medication Sig   aspirin EC 81 MG tablet Take 81 mg by mouth daily.   fluticasone (FLONASE) 50 MCG/ACT nasal spray PLACE 1 SPRAY IN EACH NOSTRIL AT BEDTIME   Influenza Vac High-Dose Quad (FLUZONE HIGH-DOSE QUADRIVALENT) 0.7 ML SUSY Fluzone High-Dose Quad 2020-21 (PF) 240 mcg/0.7 mL IM syringe   meloxicam (MOBIC) 15 MG tablet Take 15 mg by mouth daily.   [DISCONTINUED] atenolol (TENORMIN) 25 MG tablet Take 1/2 tablet po QPM   [DISCONTINUED] venlafaxine XR (EFFEXOR-XR) 75 MG 24 hr capsule TAKE 3 CAPSULES BY MOUTH EVERY DAY   [DISCONTINUED] Zoster Vaccine Adjuvanted [facility]) injection Inject 0.5 mLs into the muscle once.   [EXPIRED] Zoster Vaccine Adjuvanted [facility]) injection Inject 0.5 mLs into the muscle once for 1 dose.   No facility-administered encounter medications on file as of 11/19/2020.    Surgical History: Past Surgical History:  Procedure Laterality Date   CHOLECYSTECTOMY  2014   FRACTURE SURGERY Right 2004   elbow   KNEE ARTHROSCOPY WITH MEDIAL MENISECTOMY Left 09/21/2018   Procedure: KNEE ARTHROSCOPY WITH MEDIAL AND LATERAL MENISECTOMY, SYNOVECTOMY, CHONDROPLASTY;  Surgeon: Lovell Sheehan, MD;  Location: [facility];  Service: Orthopedics;  Laterality: Left;  Diabetes-diet controlled   OPEN REDUCTION INTERNAL FIXATION (ORIF) DISTAL RADIAL FRACTURE Left 10/06/2015   Procedure: OPEN REDUCTION INTERNAL FIXATION (ORIF) DISTAL RADIAL FRACTURE;  Surgeon: Earnestine Leys, MD;  Location: [facility];  Service: Orthopedics;  Laterality: Left;   VENTRAL HERNIA REPAIR N/A 06/10/2017   Procedure: HERNIA REPAIR VENTRAL ADULT;  Surgeon: Robert Bellow, MD;  Location: [facility];  Service: General;  Laterality: N/A;    Medical History: Past Medical History:  Diagnosis Date   Depression    Diabetes mellitus without complication ([place])    pt use to take metformin but has not taken it in 64yr/lost weight/ stopped metformin/ watches diet   Hypertension    controlled on meds    Family History: Family History  Problem Relation Age of Onset   Hypertension Mother    Breast cancer Cousin     Social History   Socioeconomic History   Marital status: Married    Spouse name: Not on file   Number of children: Not on file   Years of education: Not on file   Highest education level: Not on file  Occupational History   Not on file  Tobacco Use   Smoking status: Former    Packs/day: 0.50    Years: 20.00    Pack years: 10.00    Types: Cigarettes    Quit date: 03/08/1993    Years since quitting: 27.7   Smokeless tobacco: Never  Vaping Use  Vaping Use: Never used  Substance and Sexual Activity   Alcohol use: Yes    Alcohol/week: 7.0 standard drinks    Types: 7 Cans of beer per week    Comment: occasionally   Drug use: No   Sexual activity: Not on file  Other Topics Concern   Not on file  Social History Narrative   Not on file   Social Determinants of Health   Financial Resource Strain: Low Risk    Difficulty of Paying Living Expenses: Not very hard  Food Insecurity: Not on file  Transportation Needs: Not on file  Physical Activity: Not on file  Stress: Not on file   Social Connections: Not on file  Intimate Partner Violence: Not on file      Review of Systems  Constitutional:  Negative for chills, fatigue and unexpected weight change.  HENT:  Negative for congestion, rhinorrhea, sneezing and sore throat.   Eyes:  Negative for redness.  Respiratory:  Negative for cough, chest tightness and shortness of breath.   Cardiovascular:  Negative for chest pain and palpitations.  Gastrointestinal:  Negative for abdominal pain, constipation, diarrhea, nausea and vomiting.  Genitourinary:  Negative for dysuria and frequency.  Musculoskeletal:  Negative for arthralgias, back pain, joint swelling and neck pain.  Skin:  Negative for rash.  Neurological: Negative.  Negative for tremors and numbness.  Hematological:  Negative for adenopathy. Does not bruise/bleed easily.  Psychiatric/Behavioral:  Negative for behavioral problems (Depression), sleep disturbance and suicidal ideas. The patient is not nervous/anxious.    Vital Signs: BP 140/70 Comment: 159/65  Pulse 65   Temp 98.8 F (37.1 C)   Resp 16   Ht 5' 4"  (1.626 m)   Wt 146 lb 12.8 oz (66.6 kg)   SpO2 98%   BMI 25.20 kg/m    Physical Exam Vitals reviewed.  Constitutional:      General: She is not in acute distress.    Appearance: Normal appearance. She is normal weight. She is not ill-appearing.  HENT:     Head: Normocephalic and atraumatic.  Eyes:     Extraocular Movements: Extraocular movements intact.     Pupils: Pupils are equal, round, and reactive to light.  Cardiovascular:     Rate and Rhythm: Normal rate and regular rhythm.  Pulmonary:     Effort: Pulmonary effort is normal. No respiratory distress.  Neurological:     Mental Status: She is alert and oriented to person, place, and time.     Cranial Nerves: No cranial nerve deficit.     Coordination: Coordination normal.     Gait: Gait normal.  Psychiatric:        Mood and Affect: Mood normal.        Behavior: Behavior normal.        Assessment/Plan: 1. Diabetes mellitus without complication (HCC) A0T is stable, routine labs ordered for upcoming annual wellness visit.  - POCT HgB A1C - CBC with Differential/Platelet - CMP14+EGFR - TSH + free T4 - Lipid Profile  2. Vitamin D deficiency Rule out low vitamin D - Vitamin D (25 hydroxy)  3. Encounter for screening mammogram for malignant neoplasm of breast Routine mammogram ordered - MM Digital Screening; Future  4. Needs flu shot Flu vaccine administered in office today.  - Flu Vaccine MDCK QUAD PF   General Counseling: Venetta verbalizes understanding of the findings of todays visit and agrees with plan of treatment. I have discussed any further diagnostic evaluation that may be needed or  ordered today. We also reviewed her medications today. she has been encouraged to call the office with any questions or concerns that should arise related to todays visit.    Orders Placed This Encounter  Procedures   MM Digital Screening   Flu Vaccine MDCK QUAD PF   CBC with Differential/Platelet   CMP14+EGFR   TSH + free T4   Lipid Profile   Vitamin D (25 hydroxy)   POCT HgB A1C    Meds ordered this encounter  Medications   Zoster Vaccine Adjuvanted [facility]) injection    Sig: Inject 0.5 mLs into the muscle once for 1 dose.    Dispense:  0.5 mL    Refill:  0    Return in about 3 months (around 02/18/2021) for CPE, Hanad Leino PCP.   Total time spent:30 Minutes Time spent includes review of chart, medications, test results, and follow up plan with the patient.   [place] Controlled Substance Database was reviewed by me.  This patient was seen by [name], FNP-C in collaboration with Dr. Clayborn Bigness as a part of collaborative care agreement.   Ashok Sawaya R. Valetta Fuller, MSN, FNP-C Internal medicine

## 2020-11-21 ENCOUNTER — Other Ambulatory Visit: Payer: Self-pay

## 2020-11-24 ENCOUNTER — Other Ambulatory Visit: Payer: Self-pay

## 2020-11-24 DIAGNOSIS — F331 Major depressive disorder, recurrent, moderate: Secondary | ICD-10-CM

## 2020-11-24 DIAGNOSIS — I10 Essential (primary) hypertension: Secondary | ICD-10-CM

## 2020-11-24 MED ORDER — ATENOLOL 25 MG PO TABS: ORAL_TABLET | ORAL | 0 refills | Status: DC

## 2020-11-24 MED ORDER — VENLAFAXINE HCL ER 75 MG PO CP24: 225.0000 mg | ORAL_CAPSULE | Freq: Every day | ORAL | 0 refills | Status: DC

## 2020-11-24 NOTE — Telephone Encounter (Signed)
[facility] for atenolol and venlafaxine

## 2020-11-24 NOTE — Telephone Encounter (Signed)
-----   Message from Rosetta Posner sent at 11/20/2020  7:58 AM EDT ----- Regarding: Medication Transfer Hi can you send all of the patients routine medication to [facility] please, thanks.

## 2020-12-05 DIAGNOSIS — E119 Type 2 diabetes mellitus without complications: Secondary | ICD-10-CM | POA: Diagnosis not present

## 2020-12-05 DIAGNOSIS — I10 Essential (primary) hypertension: Secondary | ICD-10-CM | POA: Diagnosis not present

## 2020-12-11 ENCOUNTER — Other Ambulatory Visit: Payer: Self-pay

## 2020-12-11 DIAGNOSIS — I10 Essential (primary) hypertension: Secondary | ICD-10-CM

## 2020-12-11 DIAGNOSIS — F331 Major depressive disorder, recurrent, moderate: Secondary | ICD-10-CM

## 2020-12-11 MED ORDER — ATENOLOL 25 MG PO TABS: ORAL_TABLET | ORAL | 1 refills | Status: DC

## 2020-12-11 MED ORDER — FLUTICASONE PROPIONATE 50 MCG/ACT NA SUSP: NASAL | 11 refills | Status: DC

## 2020-12-11 MED ORDER — VENLAFAXINE HCL ER 75 MG PO CP24: 225.0000 mg | ORAL_CAPSULE | Freq: Every day | ORAL | 1 refills | Status: DC

## 2020-12-15 ENCOUNTER — Other Ambulatory Visit: Payer: Self-pay

## 2020-12-15 ENCOUNTER — Telehealth: Payer: Self-pay | Admitting: Pharmacist

## 2020-12-15 MED ORDER — ASPIRIN EC 81 MG PO TBEC: 81.0000 mg | DELAYED_RELEASE_TABLET | Freq: Every day | ORAL | 1 refills | Status: DC

## 2020-12-15 NOTE — Progress Notes (Addendum)
  Chronic Care Management Pharmacy Assistant   Name: Bonnie Washington  MRN: [id number] DOB: Oct 02, 1945  Reason for Encounter: Onboarding    Medications: Outpatient Encounter Medications as of 12/15/2020  Medication Sig   aspirin EC 81 MG tablet Take 81 mg by mouth daily.   atenolol (TENORMIN) 25 MG tablet Take 1/2 tablet po QPM   fluticasone (FLONASE) 50 MCG/ACT nasal spray PLACE 1 SPRAY IN EACH NOSTRIL AT BEDTIME   meloxicam (MOBIC) 15 MG tablet Take 15 mg by mouth daily.   venlafaxine XR (EFFEXOR-XR) 75 MG 24 hr capsule Take 3 capsules (225 mg total) by mouth daily.   No facility-administered encounter medications on file as of 12/15/2020.    Reviewed chart for medication changes ahead of medication coordination call.  No Consults, or hospital visits since last Pharmacist visit.  Office Visits: 11/19/20 [name], NP. For follow-up. No medication changes.  No medication changes indicated.  BP Readings from Last 3 Encounters:  11/19/20 140/70  02/18/20 (!) 150/80  11/13/19 (!) 152/74    Lab Results  Component Value Date   HGBA1C 5.8 (A) 11/19/2020     Patient wants medications through Vials  90 Days   Venlafaxine XR 75 mg 48 pills left have not taken the three today 45 at the end of the day Atenolol 25 mg 1/2 tablet in the evening pre cut (Patient is not sure when she needs a refill, she will let me know when she pre cuts them herself) Baby Aspirin 81 mcg 1 tablet daily Flonase 50 MCG/ACT nasal spray 1 bottle newly open.  Follow-Up:Pharmacist Review  Charlann Lange, RMA Clinical Pharmacist Assistant [phone]  11 minutes spent in review, coordination, and documentation.  Reviewed by: Beverly Milch, PharmD Clinical Pharmacist [phone]

## 2021-01-05 DIAGNOSIS — E119 Type 2 diabetes mellitus without complications: Secondary | ICD-10-CM | POA: Diagnosis not present

## 2021-01-05 DIAGNOSIS — I10 Essential (primary) hypertension: Secondary | ICD-10-CM | POA: Diagnosis not present

## 2021-01-23 ENCOUNTER — Telehealth: Payer: Self-pay | Admitting: Student-PharmD

## 2021-01-23 NOTE — Progress Notes (Addendum)
  Chronic Care Management Pharmacy Assistant   Name: KAMAURI KATHOL  MRN: [id number] DOB: September 05, 1945  Reason for Encounter: Medication Review/Medication Coordination Call  Medications: Outpatient Encounter Medications as of 01/23/2021  Medication Sig   aspirin EC 81 MG tablet Take 1 tablet (81 mg total) by mouth daily.   atenolol (TENORMIN) 25 MG tablet Take 1/2 tablet po QPM   fluticasone (FLONASE) 50 MCG/ACT nasal spray PLACE 1 SPRAY IN EACH NOSTRIL AT BEDTIME   meloxicam (MOBIC) 15 MG tablet Take 15 mg by mouth daily.   venlafaxine XR (EFFEXOR-XR) 75 MG 24 hr capsule Take 3 capsules (225 mg total) by mouth daily.   No facility-administered encounter medications on file as of 01/23/2021.   Reviewed chart for medication changes ahead of medication coordination call.  No OVs, Consults, or hospital visits since last care coordination call.  No medication changes indicated.  BP Readings from Last 3 Encounters:  11/19/20 140/70  02/18/20 (!) 150/80  11/13/19 (!) 152/74    Lab Results  Component Value Date   HGBA1C 5.8 (A) 11/19/2020     Patient obtains medications through Vials  90 Days   Patient is due for next adherence delivery on: 02/02/21.  Called patient and reviewed medications and coordinated delivery.  This delivery to include: Venlafaxine XR 75 mg 3 capsules once daily Atenolol 25 mg 1/2 tablet PO QPM PRECUT. Flonase 50 MCG/ACT nasal spray 1 bottle newly open.  Patient declined the following medications: Baby Aspirin 81 mcg 1 tablet daily (Delivery already made)  Patient does not need refills at this time.  Confirmed delivery date of 02/02/21, advised patient that pharmacy will contact them the morning of delivery.  Follow-Up:Pharmacist Review  Charlann Lange, RMA Clinical Pharmacist Assistant [phone]  5 minutes spent in review, coordination, and documentation.  Reviewed by: Alena Bills, PharmD Clinical Pharmacist [phone]

## 2021-02-04 DIAGNOSIS — E119 Type 2 diabetes mellitus without complications: Secondary | ICD-10-CM | POA: Diagnosis not present

## 2021-02-04 DIAGNOSIS — I10 Essential (primary) hypertension: Secondary | ICD-10-CM | POA: Diagnosis not present

## 2021-02-17 ENCOUNTER — Telehealth: Payer: Self-pay | Admitting: Student-PharmD

## 2021-02-17 NOTE — Progress Notes (Addendum)
°  Chronic Care Management Pharmacy Assistant   Name: KHRISTEN CHEYNEY  MRN: [id number] DOB: 1945/07/09  Reason for Encounter: Medication Review/Medication Coordination Call.   Medications: Outpatient Encounter Medications as of 02/17/2021  Medication Sig   aspirin EC 81 MG tablet Take 1 tablet (81 mg total) by mouth daily.   atenolol (TENORMIN) 25 MG tablet Take 1/2 tablet po QPM   fluticasone (FLONASE) 50 MCG/ACT nasal spray PLACE 1 SPRAY IN EACH NOSTRIL AT BEDTIME   meloxicam (MOBIC) 15 MG tablet Take 15 mg by mouth daily.   venlafaxine XR (EFFEXOR-XR) 75 MG 24 hr capsule Take 3 capsules (225 mg total) by mouth daily.   No facility-administered encounter medications on file as of 02/17/2021.   Reviewed chart for medication changes ahead of medication coordination call.  No OVs, Consults, or hospital visits since last care coordination call.  No medication changes indicated.  BP Readings from Last 3 Encounters:  11/19/20 140/70  02/18/20 (!) 150/80  11/13/19 (!) 152/74    Lab Results  Component Value Date   HGBA1C 5.8 (A) 11/19/2020     Patient obtains medications through Vials  30 Days   Last adherence delivery included:  Venlafaxine XR 75 mg 3 capsules once daily Atenolol 25 mg 1/2 tablet PO QPM PRECUT. Flonase 50 MCG/ACT nasal spray 1 bottle newly open.   Patient declined meds last month: Baby Aspirin 81 mcg 1 tablet daily (Delivery already made  Patient is due for next adherence delivery on: 05/05/2021.  Called patient and reviewed medications and coordinated delivery.  This delivery to include: None.  Patient declined the following medications:(Enough on hand) Venlafaxine XR 75 mg 3 capsules once daily Atenolol 25 mg 1/2 tablet PO QPM PRECUT Flonase 50 MCG/ACT nasal spray 1 bottle newly open Baby Aspirin 81 mcg 1 tablet daily   Patient does not need refills at this time.  Follow-Up:Pharmacist Review  Charlann Lange, RMA Clinical Pharmacist  Assistant [phone]  6 minutes spent in review, coordination, and documentation.  Reviewed by: Alena Bills, PharmD Clinical Pharmacist [phone]

## 2021-02-19 ENCOUNTER — Ambulatory Visit: Payer: PPO | Admitting: Nurse Practitioner

## 2021-03-06 DIAGNOSIS — E119 Type 2 diabetes mellitus without complications: Secondary | ICD-10-CM | POA: Diagnosis not present

## 2021-03-06 DIAGNOSIS — I10 Essential (primary) hypertension: Secondary | ICD-10-CM | POA: Diagnosis not present

## 2021-03-20 ENCOUNTER — Telehealth: Payer: PPO

## 2021-03-25 ENCOUNTER — Ambulatory Visit: Payer: PPO | Admitting: Student-PharmD

## 2021-03-25 ENCOUNTER — Other Ambulatory Visit: Payer: Self-pay

## 2021-03-25 DIAGNOSIS — I10 Essential (primary) hypertension: Secondary | ICD-10-CM

## 2021-03-25 DIAGNOSIS — F331 Major depressive disorder, recurrent, moderate: Secondary | ICD-10-CM

## 2021-03-25 NOTE — Progress Notes (Signed)
Follow Up Pharmacist Visit   Bonnie Washington,Bonnie Washington [id number] 84 years, Female  DOB: Aug 01, 1945  M: [phone]  Clinical Summary Situation: Patient presents via telephone for CCM F/U Visit. Background: Patient has a history of depression, diabetes, and hypertension. BP and diabetes have recently been well-controlled. 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 Assessment: Patient's blood pressure and diabetes are well-controlled. Depression is well managed with venlafaxine. Patient does plan to discuss wanting something for breakthrough anxiety at her next PCP appt Recommendations: -Continue current medication therapy -F/U with pharmacist in 5 months  Patient Chart Prep  Completed by Charlann Lange on 03/23/2021  Chronic Conditions Patient's Chronic Conditions: Hypertension (HTN), Diabetes (DM), Depression  Doctor and Hospital Visits Were there PCP Visits since last visit with the Pharmacist?: No Were there Specialist Visits since last visit with the Pharmacist?: No Was there a Hospital Visit in last 30 days?: No Were there other Hospital Visits since last visit with the Pharmacist?: No  Medication Information Have there been any medication changes from PCP or Specialist since last visit with the Pharmacist?: No Are there any Medication adherence gaps (beyond 5 days past due)?: No Medication adherence rates for the STAR rating drugs: None. List Patient's current Care Gaps: Need Breast Cancer Screening, Need Colorectal Cancer Screening  Pre-Call Questions  Are you able to connect with Patient?: Yes Visit Type: Phone Call May we confirm what is the best phone # for the pharmacist to call you?: [phone] Have you been in the hospital or emergency room recently?: No Do you have any questions or concerns that you want to make sure your pharmacist addresses with your during your appointment?: No What, if any, problems do you  have getting your medications from the pharmacy?: None Is there anything else that you would like me to pass along to your pharmacist or PCP?: No Patient reminded to bring medication bottles to visit (not a list of meds) OR have medication bottles pulled out prior to appointment time: Done  Disease Assessments  Subjective Information Current BP: 140/70 Current HR: 65 taken on: 11/18/2020 Weight: 146 BMI: 25.20 Last GFR: 89 taken on: 07/08/2020 Why did the patient present?: CCM F/U Visit Is Patient using [facility] pharmacy?: Yes Any additional demeanor/mood notes?: Patient presents via telephone and is very pleasant to speak with.  Hypertension (HTN) Assess this condition today?: Yes Is patient able to obtain BP reading today?: No Goal: <130/80 mmHG Hypertension Stage: Stage 2 (SBP >140 or DBP > 90) Is Patient checking BP at home?: Yes Patient home BP readings are ranging: no readings on hand to recall but states normally SBP <130 How often does patient miss taking their blood pressure medications?: None Has patient experienced hypotension, dizziness, falls or bradycardia?: No Does Patient use RPM device?: No BP RPM device: Does patient qualify?: No We discussed: DASH diet:  following a diet emphasizing fruits and vegetables and low-fat dairy products along with whole grains, fish, poultry, and nuts. Reducing red meats and sugars., Targeting 150 minutes of aerobic activity per week, Reducing the amount of salt intake to 1500mg /per day. Assessment:: Controlled Drug: Atenolol 25mg  1/2 tab by mouth daily in the evening Assessment: Appropriate, Effective, Safe, Accessible Plan to: Continue current medication therapy HC Follow up: Monthly call with [facility] Pharmacist Follow up: 5 months  Depression Assess this condition today?: Yes Completing the PHQ-9 Questionnaire today?: No In your opinion, how do you feel your depression symptoms  have been controlled over the past 3 months?:  Stable / stayed the same Assessment:: Controlled Drug: Venlafaxine ER 75mg  3 capsules daily Assessment: Appropriate, Effective, Safe, Accessible Additional Info: Patient states she will occasionally be irritable or anxious and plans on discussing that with PCP at next f/u appt Plan to: Continue current medication therapy HC Follow up: monthly dispensing call Pharmacist Follow up: 5 months  Exercise, Diet and Non-Drug Coordination Needs Additional exercise counseling points. We discussed: targeting at least 151 minutes per week of moderate-intensity aerobic exercise. Additional diet counseling points. We discussed: key components of the DASH diet, aiming to consume at least 8 cups of water day Discussed Non-Drug Care Coordination Needs: Yes Does Patient have Medication financial barriers?: No  Accountable Health Communities Health-Related Social Needs Screening Tool -  SDOH  What is your living situation today? (ref #1): I have a steady place to live Think about the place you live. Do you have problems with any of the following? (ref #2): None of the above Within the past 12 months, you worried that your food would run out before you got money to buy more (ref #3): Never true Within the past 12 months, the food you bought just didn't last and you didn't have money to get more (ref #4): Never true In the past 12 months, has lack of reliable transportation kept you from medical appointments, meetings, work or from getting things needed for daily living? (ref #5): No In the past 12 months, has the electric, gas, oil, or water company threatened to shut off services in your home? (ref #6): No How often does anyone, including family and friends, physically hurt you? (ref #7): Never (1) How often does anyone, including family and friends, insult or talk down to you? (ref #8): Never (1) How often does anyone, including friends and family, threaten you with harm? (ref #9): Never (1) How often does  anyone, including family and friends, scream or curse at you? (ref #10): Never (1)  Managing Depression, Adult Depression is a mental health condition that affects your thoughts, feelings, and actions. Being diagnosed with depression can bring you relief if you did not know why you have felt or behaved a certain way. It could also leave you feeling overwhelmed with uncertainty about your future. Preparing yourself to manage your symptoms can help you feel more positive about your future. How to manage lifestyle changes Managing stress Stress is your body's reaction to life changes and events, both good and bad. Stress can add to your feelings of depression. Learning to manage your stress can help lessen your feelings of depression. Try some of the following approaches to reducing your stress (stress reduction techniques): Listen to music that you enjoy and that inspires you. Try using a meditation app or take a meditation class. Develop a practice that helps you connect with your spiritual self. Walk in nature, pray, or go to a place of worship. Do some deep breathing. To do this, inhale slowly through your nose. Pause at the top of your inhale for a few seconds and then exhale slowly, letting your muscles relax. Practice yoga to help relax and work your muscles. Choose a stress reduction technique that suits your lifestyle and personality. These techniques take time and practice to develop. Set aside 5-15 minutes a day to do them. Therapists can offer training in these techniques. Other things you can do to manage stress include: Keeping a stress diary. Knowing your limits and saying no when you  think something is too much. Paying attention to how you react to certain situations. You may not be able to control everything, but you can change your reaction. Adding humor to your life by watching funny films or TV shows. Making time for activities that you enjoy and that relax you.    Medicines Medicines, such as antidepressants, are often a part of treatment for depression. Talk with your pharmacist or health care provider about all the medicines, supplements, and herbal products that you take, their possible side effects, and what medicines and other products are safe to take together. Make sure to report any side effects you may have to your health care provider. Relationships Your health care provider may suggest family therapy, couples therapy, or individual therapy as part of your treatment. How to recognize changes Everyone responds differently to treatment for depression. As you recover from depression, you may start to: Have more interest in doing activities. Feel less hopeless. Have more energy. Overeat less often, or have a better appetite. Have better mental focus. It is important to recognize if your depression is not getting better or is getting worse. The symptoms you had in the beginning may return, such as: Tiredness (fatigue) or low energy. Eating too much or too little. Sleeping too much or too little. Feeling restless, agitated, or hopeless. Trouble focusing or making decisions. Unexplained physical complaints. Feeling irritable, angry, or aggressive. If you or your family members notice these symptoms coming back, let your health care provider know right away. Follow these instructions at home: Activity Try to get some form of exercise each day, such as walking, biking, swimming, or lifting weights. Practice stress reduction techniques. Engage your mind by taking a class or doing some volunteer work. Lifestyle Get the right amount and quality of sleep. Cut down on using caffeine, tobacco, alcohol, and other potentially harmful substances. Eat a healthy diet that includes plenty of vegetables, fruits, whole grains, low-fat dairy products, and lean protein. Do not eat a lot of foods that are high in solid fats, added sugars, or salt  (sodium). General instructions Take over-the-counter and prescription medicines only as told by your health care provider. Keep all follow-up visits as told by your health care provider. This is important. Where to find support Talking to others Friends and family members can be sources of support and guidance. Talk to trusted friends or family members about your condition. Explain your symptoms to them, and let them know that you are working with a health care provider to treat your depression. Tell friends and family members how they also can be helpful. Finances Find appropriate mental health providers that fit with your financial situation. Talk with your health care provider about options to get reduced prices on your medicines. Where to find more information You can find support in your area from: Anxiety and Depression Association of America (ADAA): www.adaa.org Mental Health America: www.mentalhealthamerica.net [employer] on Mental Illness: www.nami.org Contact a health care provider if: You stop taking your antidepressant medicines, and you have any of these symptoms: Nausea. Headache. Light-headedness. Chills and body aches. Not being able to sleep (insomnia). You or your friends and family think your depression is getting worse. Get help right away if: You have thoughts of hurting yourself or others. If you ever feel like you may hurt yourself or others, or have thoughts about taking your own life, get help right away. Go to your nearest emergency department or: Call your local emergency services (911 in the  [place] Call a suicide crisis helpline, such as the [facility] at (615)837-2034 or 988 in the [place]. This is open 24 hours a day in the [place] Text the Crisis Text Line at 541-092-8360 (in the [place].). Summary If you are diagnosed with depression, preparing yourself to manage your symptoms is a good way to feel positive about your future. Work with  your health care provider on a management plan that includes stress reduction techniques, medicines (if applicable), therapy, and healthy lifestyle habits. Keep talking with your health care provider about how your treatment is working. If you have thoughts about taking your own life, call a suicide crisis helpline or text a crisis text line. This information is not intended to replace advice given to you by your health care provider. Make sure you discuss any questions you have with your health care provider.       Alena Bills Clinical Pharmacist [phone]

## 2021-04-05 DIAGNOSIS — E119 Type 2 diabetes mellitus without complications: Secondary | ICD-10-CM | POA: Diagnosis not present

## 2021-04-05 DIAGNOSIS — I10 Essential (primary) hypertension: Secondary | ICD-10-CM | POA: Diagnosis not present

## 2021-04-15 ENCOUNTER — Telehealth: Payer: Self-pay

## 2021-04-15 NOTE — Telephone Encounter (Signed)
Left vm to confirm 04/16/21 appointment-Toni °

## 2021-04-16 ENCOUNTER — Ambulatory Visit (INDEPENDENT_AMBULATORY_CARE_PROVIDER_SITE_OTHER): Payer: PPO | Admitting: Nurse Practitioner

## 2021-04-16 ENCOUNTER — Other Ambulatory Visit: Payer: Self-pay

## 2021-04-16 ENCOUNTER — Encounter: Payer: Self-pay | Admitting: Nurse Practitioner

## 2021-04-16 VITALS — BP 160/80 | HR 69 | Temp 98.8°F | Resp 16 | Ht 64.0 in | Wt 150.4 lb

## 2021-04-16 DIAGNOSIS — Z1212 Encounter for screening for malignant neoplasm of rectum: Secondary | ICD-10-CM | POA: Diagnosis not present

## 2021-04-16 DIAGNOSIS — Z0001 Encounter for general adult medical examination with abnormal findings: Secondary | ICD-10-CM

## 2021-04-16 DIAGNOSIS — E119 Type 2 diabetes mellitus without complications: Secondary | ICD-10-CM

## 2021-04-16 DIAGNOSIS — I10 Essential (primary) hypertension: Secondary | ICD-10-CM | POA: Diagnosis not present

## 2021-04-16 DIAGNOSIS — R300 Dysuria: Secondary | ICD-10-CM

## 2021-04-16 DIAGNOSIS — R6889 Other general symptoms and signs: Secondary | ICD-10-CM | POA: Diagnosis not present

## 2021-04-16 DIAGNOSIS — I75023 Atheroembolism of bilateral lower extremities: Secondary | ICD-10-CM | POA: Diagnosis not present

## 2021-04-16 DIAGNOSIS — F331 Major depressive disorder, recurrent, moderate: Secondary | ICD-10-CM

## 2021-04-16 DIAGNOSIS — Z1211 Encounter for screening for malignant neoplasm of colon: Secondary | ICD-10-CM

## 2021-04-16 LAB — POCT GLYCOSYLATED HEMOGLOBIN (HGB A1C): Hemoglobin A1C: 5.8 % — AB (ref 4.0–5.6)

## 2021-04-16 MED ORDER — VENLAFAXINE HCL ER 75 MG PO CP24: 225.0000 mg | ORAL_CAPSULE | Freq: Every day | ORAL | 1 refills | Status: DC

## 2021-04-16 MED ORDER — ATENOLOL 25 MG PO TABS: ORAL_TABLET | ORAL | 1 refills | Status: DC

## 2021-04-16 NOTE — Progress Notes (Cosign Needed)
[facility] [place], [place] [address]  Internal MEDICINE  Office Visit Note  Patient Name: Bonnie Washington  [id number]  [id number]  Date of Service: 04/16/2021  Chief Complaint  Patient presents with   Medicare Wellness    Discuss feet, has some tingling in pads of feet    Depression   Diabetes   Hypertension   Sinusitis    HPI Brielynn presents for an annual well visit and physical exam.  She is a well-appearing 76 year old female with hypertension, diabetes, and vitamin D deficiency.  She has also had problems with circulation in her feet over the past 5 to 6 months with her feet feeling cold and a purple discoloration to the soles of her feet and toes.  She reports that she also feels tingling and burning in her feet Due to her symptoms, ABI was performed in office today -- right was 0.88 and left was 0.91 She is due for colorectal cancer screening and is opting for the cologuard stool test.  She has not had her labs drawn yet. Her routine mammogram has been ordered but has not been done yet.     Current Medication: Outpatient Encounter Medications as of 04/16/2021  Medication Sig   aspirin EC 81 MG tablet Take 1 tablet (81 mg total) by mouth daily.   fluticasone (FLONASE) 50 MCG/ACT nasal spray PLACE 1 SPRAY IN EACH NOSTRIL AT BEDTIME   meloxicam (MOBIC) 15 MG tablet Take 15 mg by mouth daily.   [DISCONTINUED] atenolol (TENORMIN) 25 MG tablet Take 1/2 tablet po QPM   [DISCONTINUED] venlafaxine XR (EFFEXOR-XR) 75 MG 24 hr capsule Take 3 capsules (225 mg total) by mouth daily.   atenolol (TENORMIN) 25 MG tablet Take 1/2 tablet po QPM   venlafaxine XR (EFFEXOR-XR) 75 MG 24 hr capsule Take 3 capsules (225 mg total) by mouth daily.   No facility-administered encounter medications on file as of 04/16/2021.    Surgical History: Past Surgical History:  Procedure Laterality Date   CHOLECYSTECTOMY  2014   FRACTURE SURGERY Right 2004   elbow   KNEE ARTHROSCOPY  WITH MEDIAL MENISECTOMY Left 09/21/2018   Procedure: KNEE ARTHROSCOPY WITH MEDIAL AND LATERAL MENISECTOMY, SYNOVECTOMY, CHONDROPLASTY;  Surgeon: Lovell Sheehan, MD;  Location: [facility];  Service: Orthopedics;  Laterality: Left;  Diabetes-diet controlled   OPEN REDUCTION INTERNAL FIXATION (ORIF) DISTAL RADIAL FRACTURE Left 10/06/2015   Procedure: OPEN REDUCTION INTERNAL FIXATION (ORIF) DISTAL RADIAL FRACTURE;  Surgeon: Earnestine Leys, MD;  Location: [facility];  Service: Orthopedics;  Laterality: Left;   VENTRAL HERNIA REPAIR N/A 06/10/2017   Procedure: HERNIA REPAIR VENTRAL ADULT;  Surgeon: Robert Bellow, MD;  Location: [facility];  Service: General;  Laterality: N/A;    Medical History: Past Medical History:  Diagnosis Date   Depression    Diabetes mellitus without complication ([place])    pt use to take metformin but has not taken it in 76yrs/lost weight/ stopped metformin/ watches diet   Hypertension    controlled on meds    Family History: Family History  Problem Relation Age of Onset   Hypertension Mother    Breast cancer Cousin     Social History   Socioeconomic History   Marital status: Married    Spouse name: Not on file   Number of children: Not on file   Years of education: Not on file   Highest education level: Not on file  Occupational History   Not on file  Tobacco  Use   Smoking status: Former    Packs/day: 0.50    Years: 20.00    Pack years: 10.00    Types: Cigarettes    Quit date: 03/08/1993    Years since quitting: 28.2   Smokeless tobacco: Never  Vaping Use   Vaping Use: Never used  Substance and Sexual Activity   Alcohol use: Yes    Alcohol/week: 7.0 standard drinks    Types: 7 Cans of beer per week    Comment: occasionally   Drug use: No   Sexual activity: Not on file  Other Topics Concern   Not on file  Social History Narrative   Not on file   Social Determinants of Health   Financial Resource Strain: Low Risk    Difficulty of Paying  Living Expenses: Not very hard  Food Insecurity: Not on file  Transportation Needs: Not on file  Physical Activity: Not on file  Stress: Not on file  Social Connections: Not on file  Intimate Partner Violence: Not on file      Review of Systems  Constitutional:  Negative for activity change, appetite change, chills, fatigue, fever and unexpected weight change.  HENT: Negative.  Negative for congestion, ear pain, rhinorrhea, sore throat and trouble swallowing.   Eyes: Negative.   Respiratory: Negative.  Negative for cough, chest tightness, shortness of breath and wheezing.   Cardiovascular: Negative.  Negative for chest pain.  Gastrointestinal: Negative.  Negative for abdominal pain, blood in stool, constipation, diarrhea, nausea and vomiting.  Endocrine: Negative.   Genitourinary: Negative.  Negative for difficulty urinating, dysuria, frequency, hematuria and urgency.  Musculoskeletal: Negative.  Negative for arthralgias, back pain, joint swelling, myalgias and neck pain.  Skin: Negative.  Negative for rash and wound.  Allergic/Immunologic: Negative.  Negative for immunocompromised state.  Neurological: Negative.  Negative for dizziness, seizures, numbness and headaches.  Hematological: Negative.   Psychiatric/Behavioral:  Positive for depression. Negative for behavioral problems, self-injury and suicidal ideas. The patient is not nervous/anxious.    Vital Signs: BP (!) 160/80 Comment: 182/90   Pulse 69    Temp 98.8 F (37.1 C)    Resp 16    Ht 5\' 4"  (1.626 m)    Wt 150 lb 6.4 oz (68.2 kg)    SpO2 99%    BMI 25.82 kg/m    Physical Exam Vitals reviewed.  Constitutional:      General: She is awake. She is not in acute distress.    Appearance: Normal appearance. She is well-developed, well-groomed and normal weight. She is not ill-appearing or diaphoretic.  HENT:     Head: Normocephalic and atraumatic.     Right Ear: Tympanic membrane, ear canal and external ear normal.     Left  Ear: Tympanic membrane, ear canal and external ear normal.     Nose: Nose normal. No congestion or rhinorrhea.     Mouth/Throat:     Lips: Pink.     Mouth: Mucous membranes are moist.     Pharynx: Oropharynx is clear. Uvula midline. No oropharyngeal exudate or posterior oropharyngeal erythema.  Eyes:     General: Lids are normal. Vision grossly intact. Gaze aligned appropriately. No scleral icterus.       Right eye: No discharge.        Left eye: No discharge.     Extraocular Movements: Extraocular movements intact.     Conjunctiva/sclera: Conjunctivae normal.     Pupils: Pupils are equal, round, and reactive to light.  Funduscopic exam:    Right eye: Red reflex present.        Left eye: Red reflex present. Neck:     Thyroid: No thyromegaly.     Vascular: No JVD.     Trachea: Trachea and phonation normal. No tracheal deviation.  Cardiovascular:     Rate and Rhythm: Normal rate and regular rhythm.     Pulses: Normal pulses.     Heart sounds: Normal heart sounds, S1 normal and S2 normal. No murmur heard.   No friction rub. No gallop.  Pulmonary:     Effort: Pulmonary effort is normal. No accessory muscle usage or respiratory distress.     Breath sounds: Normal breath sounds and air entry. No stridor. No wheezing or rales.  Chest:     Chest wall: No tenderness.     Comments: Declined breast exam, patient will get mammogram done.  Abdominal:     General: Bowel sounds are normal. There is no distension.     Palpations: Abdomen is soft. There is no shifting dullness, fluid wave, mass or pulsatile mass.     Tenderness: There is no abdominal tenderness. There is no guarding or rebound.  Musculoskeletal:        General: No tenderness or deformity. Normal range of motion.     Cervical back: Normal range of motion and neck supple.     Right lower leg: No edema.     Left lower leg: No edema.  Lymphadenopathy:     Cervical: No cervical adenopathy.  Skin:    General: Skin is warm and  dry.     Capillary Refill: Capillary refill takes less than 2 seconds.     Coloration: Skin is not pale.     Findings: No erythema or rash.  Neurological:     Mental Status: She is alert and oriented to person, place, and time.     Cranial Nerves: No cranial nerve deficit.     Motor: No abnormal muscle tone.     Coordination: Coordination normal.     Deep Tendon Reflexes: Reflexes are normal and symmetric.  Psychiatric:        Mood and Affect: Mood and affect normal.        Behavior: Behavior normal. Behavior is cooperative.        Thought Content: Thought content normal.        Judgment: Judgment normal.       Assessment/Plan: 1. Encounter for routine adult health examination with abnormal findings Age-appropriate preventive screenings and vaccinations discussed, annual physical exam completed. Routine labs for health maintenance previously ordered but patient has not had labs drawn. Reminded patient to have labs drawn. Reminded patient to schedule routine mammogram. PHM updated.   2. Purple toe syndrome of both feet ([place]) ABI done in office, results was abnormal  - POCT ABI Screening Pilot No Charge  3. Abnormal ankle brachial index (ABI) Right ABI was 0.88 which is slightly low, and concern for PAD especially since she is symptomatic. Vascular ultrasound ordered. Consider referral to vascular surgery.  - US ARTERIAL LOWER EXTREMITY DUPLEX BILATERAL; Future  4. Diabetes mellitus without complication (HCC) D4K remains stable at 5.8 no change. She is diet-controlled and does not currently take any medication.  - POCT HgB A1C  5. Essential hypertension Refill ordered, patient instructed to take 1 whole tablet daily to see if this improves her blood pressure. Follow up in 1 month.  - atenolol (TENORMIN) 25 MG tablet; Take 1/2 tablet po  QPM  Dispense: 90 tablet; Refill: 1  6. Screening for colorectal cancer Cologuard test ordered.  - Cologuard  7. Dysuria Routine urinalysis  done  - UA/M w/rflx Culture, Routine - Microscopic Examination - Urine Culture, Reflex  8. Moderate recurrent major depression (HCC) Stable, refills ordered.  - venlafaxine XR (EFFEXOR-XR) 75 MG 24 hr capsule; Take 3 capsules (225 mg total) by mouth daily.  Dispense: 270 capsule; Refill: 1      General Counseling: Iyahna verbalizes understanding of the findings of todays visit and agrees with plan of treatment. I have discussed any further diagnostic evaluation that may be needed or ordered today. We also reviewed her medications today. she has been encouraged to call the office with any questions or concerns that should arise related to todays visit.    Orders Placed This Encounter  Procedures   Microscopic Examination   Urine Culture, Reflex   US ARTERIAL LOWER EXTREMITY DUPLEX BILATERAL   UA/M w/rflx Culture, Routine   Cologuard   POCT HgB A1C   POCT ABI Screening Pilot No Charge    Meds ordered this encounter  Medications   atenolol (TENORMIN) 25 MG tablet    Sig: Take 1/2 tablet po QPM    Dispense:  90 tablet    Refill:  1   venlafaxine XR (EFFEXOR-XR) 75 MG 24 hr capsule    Sig: Take 3 capsules (225 mg total) by mouth daily.    Dispense:  270 capsule    Refill:  1    DX Code: F33.1    Return in about 1 month (around 05/14/2021) for F/U, U/S @ [facility] PCP.   Total time spent:30 Minutes Time spent includes review of chart, medications, test results, and follow up plan with the patient.   [place] Controlled Substance Database was reviewed by me.  This patient was seen by [name], FNP-C in collaboration with Dr. Clayborn Bigness as a part of collaborative care agreement.  Korban Shearer R. Valetta Fuller, MSN, FNP-C Internal medicine

## 2021-04-19 LAB — URINE CULTURE, REFLEX

## 2021-04-19 LAB — MICROSCOPIC EXAMINATION
Bacteria, UA: NONE SEEN
Casts: NONE SEEN /lpf
Epithelial Cells (non renal): NONE SEEN /hpf (ref 0–10)
RBC, Urine: NONE SEEN /hpf (ref 0–2)

## 2021-04-19 LAB — UA/M W/RFLX CULTURE, ROUTINE
Bilirubin, UA: NEGATIVE
Glucose, UA: NEGATIVE
Ketones, UA: NEGATIVE
Nitrite, UA: NEGATIVE
Protein,UA: NEGATIVE
RBC, UA: NEGATIVE
Specific Gravity, UA: 1.005 (ref 1.005–1.030)
Urobilinogen, Ur: 0.2 mg/dL (ref 0.2–1.0)
pH, UA: 7 (ref 5.0–7.5)

## 2021-04-29 ENCOUNTER — Other Ambulatory Visit: Payer: Self-pay

## 2021-04-29 ENCOUNTER — Telehealth: Payer: Self-pay

## 2021-04-29 MED ORDER — DOXYCYCLINE HYCLATE 100 MG PO TABS: 100.0000 mg | ORAL_TABLET | Freq: Two times a day (BID) | ORAL | 0 refills | Status: DC

## 2021-04-29 NOTE — Telephone Encounter (Signed)
error 

## 2021-05-04 ENCOUNTER — Encounter: Payer: Self-pay | Admitting: Nurse Practitioner

## 2021-05-04 ENCOUNTER — Telehealth (INDEPENDENT_AMBULATORY_CARE_PROVIDER_SITE_OTHER): Payer: PPO | Admitting: Nurse Practitioner

## 2021-05-04 ENCOUNTER — Other Ambulatory Visit: Payer: Self-pay

## 2021-05-04 VITALS — Resp 16 | Ht 64.0 in | Wt 145.0 lb

## 2021-05-04 DIAGNOSIS — J018 Other acute sinusitis: Secondary | ICD-10-CM | POA: Diagnosis not present

## 2021-05-04 DIAGNOSIS — R051 Acute cough: Secondary | ICD-10-CM

## 2021-05-04 DIAGNOSIS — R062 Wheezing: Secondary | ICD-10-CM

## 2021-05-04 MED ORDER — PREDNISONE 10 MG PO TABS
ORAL_TABLET | ORAL | 0 refills | Status: DC
Start: 2021-05-04 — End: 2021-05-21

## 2021-05-04 MED ORDER — BENZONATATE 200 MG PO CAPS: 200.0000 mg | ORAL_CAPSULE | Freq: Two times a day (BID) | ORAL | 0 refills | Status: DC | PRN

## 2021-05-04 NOTE — Progress Notes (Signed)
[facility] [place], [place] [address]  Internal MEDICINE  Telephone Visit  Patient Name: Bonnie Washington  [id number]  [id number]  Date of Service: 05/04/2021  I connected with the patient at 1:30 PM by telephone and verified the patients identity using two identifiers.   I discussed the limitations, risks, security and privacy concerns of performing an evaluation and management service by telephone and the availability of in person appointments. I also discussed with the patient that there may be a patient responsible charge related to the service.  The patient expressed understanding and agrees to proceed.    Chief Complaint  Patient presents with   Acute Visit    Back pain, hurts between shoulder blades, weakness   Telephone Screen    Video    Telephone Assessment    [phone]    Sinusitis    Antibiotic from last week pt has 8 pills left   Cough    HPI Bonnie Washington presents for a telehealth virtual visit for symptoms of sinusitis.  At her previous office visit on February 9 patient was treated for a sinus infection with doxycycline.  Patient reports that she has not improved and continues to have body aches, cough, sinus drainage, weakness and fatigue.  Her symptoms have improved with the antibiotic but she reports that she still has the cough and some wheezing and chest tightness.  She is asking for prednisone taper and medication for cough.   Current Medication: Outpatient Encounter Medications as of 05/04/2021  Medication Sig   aspirin EC 81 MG tablet Take 1 tablet (81 mg total) by mouth daily.   atenolol (TENORMIN) 25 MG tablet Take 1/2 tablet po QPM   benzonatate (TESSALON) 200 MG capsule Take 1 capsule (200 mg total) by mouth 2 (two) times daily as needed for cough.   doxycycline (VIBRA-TABS) 100 MG tablet Take 1 tablet (100 mg total) by mouth 2 (two) times daily. For 10 days   fluticasone (FLONASE) 50 MCG/ACT nasal spray PLACE 1 SPRAY IN EACH NOSTRIL AT  BEDTIME   meloxicam (MOBIC) 15 MG tablet Take 15 mg by mouth daily.   predniSONE (DELTASONE) 10 MG tablet Take one tab 3 x day for 3 days, then take one tab 2 x a day for 3 days and then take one tab a day for 3 days for uri   venlafaxine XR (EFFEXOR-XR) 75 MG 24 hr capsule Take 3 capsules (225 mg total) by mouth daily.   No facility-administered encounter medications on file as of 05/04/2021.    Surgical History: Past Surgical History:  Procedure Laterality Date   CHOLECYSTECTOMY  2014   FRACTURE SURGERY Right 2004   elbow   KNEE ARTHROSCOPY WITH MEDIAL MENISECTOMY Left 09/21/2018   Procedure: KNEE ARTHROSCOPY WITH MEDIAL AND LATERAL MENISECTOMY, SYNOVECTOMY, CHONDROPLASTY;  Surgeon: Lovell Sheehan, MD;  Location: [place];  Service: Orthopedics;  Laterality: Left;  Diabetes-diet controlled   OPEN REDUCTION INTERNAL FIXATION (ORIF) DISTAL RADIAL FRACTURE Left 10/06/2015   Procedure: OPEN REDUCTION INTERNAL FIXATION (ORIF) DISTAL RADIAL FRACTURE;  Surgeon: Earnestine Leys, MD;  Location: [facility];  Service: Orthopedics;  Laterality: Left;   VENTRAL HERNIA REPAIR N/A 06/10/2017   Procedure: HERNIA REPAIR VENTRAL ADULT;  Surgeon: Robert Bellow, MD;  Location: [facility];  Service: General;  Laterality: N/A;    Medical History: Past Medical History:  Diagnosis Date   Depression    Diabetes mellitus without complication ([place])    pt use to take metformin  but has not taken it in 98yrs/lost weight/ stopped metformin/ watches diet   Hypertension    controlled on meds    Family History: Family History  Problem Relation Age of Onset   Hypertension Mother    Breast cancer Cousin     Social History   Socioeconomic History   Marital status: Married    Spouse name: Not on file   Number of children: Not on file   Years of education: Not on file   Highest education level: Not on file  Occupational History   Not on file  Tobacco Use   Smoking status: Former    Packs/day: 0.50     Years: 20.00    Pack years: 10.00    Types: Cigarettes    Quit date: 03/08/1993    Years since quitting: 28.1   Smokeless tobacco: Never  Vaping Use   Vaping Use: Never used  Substance and Sexual Activity   Alcohol use: Yes    Alcohol/week: 7.0 standard drinks    Types: 7 Cans of beer per week    Comment: occasionally   Drug use: No   Sexual activity: Not on file  Other Topics Concern   Not on file  Social History Narrative   Not on file   Social Determinants of Health   Financial Resource Strain: Low Risk    Difficulty of Paying Living Expenses: Not very hard  Food Insecurity: Not on file  Transportation Needs: Not on file  Physical Activity: Not on file  Stress: Not on file  Social Connections: Not on file  Intimate Partner Violence: Not on file      Review of Systems  Constitutional:  Positive for fatigue. Negative for chills.  HENT:  Positive for congestion, postnasal drip, rhinorrhea, sinus pressure, sinus pain and sore throat.   Respiratory:  Positive for cough. Negative for chest tightness, shortness of breath and wheezing.   Cardiovascular: Negative.  Negative for chest pain and palpitations.  Gastrointestinal:  Negative for abdominal pain, constipation, diarrhea, nausea and vomiting.  Musculoskeletal:  Positive for myalgias.  Skin:  Negative for rash.  Neurological:  Positive for weakness. Negative for dizziness, light-headedness and headaches.   Vital Signs: Resp 16    Ht 5\' 4"  (1.626 m)    Wt 145 lb (65.8 kg)    BMI 24.89 kg/m    Observation/Objective: She is alert and oriented and engages in conversation appropriately.  Patient does not sound as though she is in any acute distress over telephone call.    Assessment/Plan: 1. Acute non-recurrent sinusitis of other sinus Continue doxycycline until course is complete  2. Wheezing Prednisone taper prescribed to decrease inflammation and wheezing.  - predniSONE (DELTASONE) 10 MG tablet; Take one tab  3 x day for 3 days, then take one tab 2 x a day for 3 days and then take one tab a day for 3 days for uri  Dispense: 18 tablet; Refill: 0  3. Acute cough Medication prescribed for symptomatic relief of cough - benzonatate (TESSALON) 200 MG capsule; Take 1 capsule (200 mg total) by mouth 2 (two) times daily as needed for cough.  Dispense: 30 capsule; Refill: 0   General Counseling: Destyni verbalizes understanding of the findings of today's phone visit and agrees with plan of treatment. I have discussed any further diagnostic evaluation that may be needed or ordered today. We also reviewed her medications today. she has been encouraged to call the office with any questions or concerns that should  arise related to todays visit.  Return if symptoms worsen or fail to improve.   No orders of the defined types were placed in this encounter.   Meds ordered this encounter  Medications   predniSONE (DELTASONE) 10 MG tablet    Sig: Take one tab 3 x day for 3 days, then take one tab 2 x a day for 3 days and then take one tab a day for 3 days for uri    Dispense:  18 tablet    Refill:  0   benzonatate (TESSALON) 200 MG capsule    Sig: Take 1 capsule (200 mg total) by mouth 2 (two) times daily as needed for cough.    Dispense:  30 capsule    Refill:  0    Time spent:10 Minutes Time spent with patient included reviewing progress notes, labs, imaging studies, and discussing plan for follow up.  [place] Controlled Substance Database was reviewed by me for overdose risk score (ORS) if appropriate.  This patient was seen by [name], FNP-C in collaboration with Dr. Clayborn Bigness as a part of collaborative care agreement.  Taiesha Bovard R. Valetta Fuller, MSN, FNP-C Internal medicine

## 2021-05-13 ENCOUNTER — Ambulatory Visit: Payer: PPO

## 2021-05-13 ENCOUNTER — Other Ambulatory Visit: Payer: Self-pay

## 2021-05-13 DIAGNOSIS — R6889 Other general symptoms and signs: Secondary | ICD-10-CM | POA: Diagnosis not present

## 2021-05-16 ENCOUNTER — Encounter: Payer: Self-pay | Admitting: Nurse Practitioner

## 2021-05-21 ENCOUNTER — Ambulatory Visit (INDEPENDENT_AMBULATORY_CARE_PROVIDER_SITE_OTHER): Payer: PPO | Admitting: Nurse Practitioner

## 2021-05-21 ENCOUNTER — Encounter: Payer: Self-pay | Admitting: Nurse Practitioner

## 2021-05-21 ENCOUNTER — Other Ambulatory Visit: Payer: Self-pay

## 2021-05-21 VITALS — BP 142/70 | HR 63 | Temp 98.2°F | Resp 16 | Ht 64.0 in | Wt 149.0 lb

## 2021-05-21 DIAGNOSIS — R0989 Other specified symptoms and signs involving the circulatory and respiratory systems: Secondary | ICD-10-CM | POA: Diagnosis not present

## 2021-05-21 DIAGNOSIS — R6889 Other general symptoms and signs: Secondary | ICD-10-CM

## 2021-05-21 DIAGNOSIS — I10 Essential (primary) hypertension: Secondary | ICD-10-CM | POA: Diagnosis not present

## 2021-05-21 DIAGNOSIS — G5793 Unspecified mononeuropathy of bilateral lower limbs: Secondary | ICD-10-CM

## 2021-05-21 NOTE — Progress Notes (Signed)
[facility] ?[address] ?[place],  [address] ? ?Internal MEDICINE  ?Office Visit Note ? ?Patient Name: Bonnie Washington ? [id number]  ?[id number] ? ?Date of Service: 05/21/2021 ? ?Chief Complaint  ?Patient presents with  ? Depression  ? Diabetes  ? Hypertension  ? Results  ?  [place]  ? ? ?HPI ?Bonnie Washington presents for a follow-up visit to review ultrasound results.  Patient had a vascular arterial ultrasound of bilateral lower extremities to assess for peripheral arterial disease due to persistent neuropathic pain in her feet and discoloration of her feet.  The ultrasound was normal and showed no significant stenosis of the arteries in her legs bilaterally. ? ? ? ?Current Medication: ?Outpatient Encounter Medications as of 05/21/2021  ?Medication Sig  ? aspirin EC 81 MG tablet Take 1 tablet (81 mg total) by mouth daily.  ? atenolol (TENORMIN) 25 MG tablet Take 1/2 tablet po QPM  ? fluticasone (FLONASE) 50 MCG/ACT nasal spray PLACE 1 SPRAY IN EACH NOSTRIL AT BEDTIME  ? meloxicam (MOBIC) 15 MG tablet Take 15 mg by mouth daily.  ? venlafaxine XR (EFFEXOR-XR) 75 MG 24 hr capsule Take 3 capsules (225 mg total) by mouth daily.  ? [DISCONTINUED] benzonatate (TESSALON) 200 MG capsule Take 1 capsule (200 mg total) by mouth 2 (two) times daily as needed for cough. (Patient not taking: Reported on 05/21/2021)  ? [DISCONTINUED] doxycycline (VIBRA-TABS) 100 MG tablet Take 1 tablet (100 mg total) by mouth 2 (two) times daily. For 10 days (Patient not taking: Reported on 05/21/2021)  ? [DISCONTINUED] predniSONE (DELTASONE) 10 MG tablet Take one tab 3 x day for 3 days, then take one tab 2 x a day for 3 days and then take one tab a day for 3 days for uri (Patient not taking: Reported on 05/21/2021)  ? ?No facility-administered encounter medications on file as of 05/21/2021.  ? ? ?Surgical History: ?Past Surgical History:  ?Procedure Laterality Date  ? CHOLECYSTECTOMY  2014  ? FRACTURE SURGERY Right 2004  ? elbow  ? KNEE ARTHROSCOPY  WITH MEDIAL MENISECTOMY Left 09/21/2018  ? Procedure: KNEE ARTHROSCOPY WITH MEDIAL AND LATERAL MENISECTOMY, SYNOVECTOMY, CHONDROPLASTY;  Surgeon: Lovell Sheehan, MD;  Location: [place] [facility];  Service: Orthopedics;  Laterality: Left;  Diabetes-diet controlled  ? OPEN REDUCTION INTERNAL FIXATION (ORIF) DISTAL RADIAL FRACTURE Left 10/06/2015  ? Procedure: OPEN REDUCTION INTERNAL FIXATION (ORIF) DISTAL RADIAL FRACTURE;  Surgeon: Earnestine Leys, MD;  Location: [facility];  Service: Orthopedics;  Laterality: Left;  ? VENTRAL HERNIA REPAIR N/A 06/10/2017  ? Procedure: HERNIA REPAIR VENTRAL ADULT;  Surgeon: Robert Bellow, MD;  Location: [facility];  Service: General;  Laterality: N/A;  ? ? ?Medical History: ?Past Medical History:  ?Diagnosis Date  ? Depression   ? Diabetes mellitus without complication ([place])   ? pt use to take metformin but has not taken it in 33yr/lost weight/ stopped metformin/ watches diet  ? Hypertension   ? controlled on meds  ? ? ?Family History: ?Family History  ?Problem Relation Age of Onset  ? Hypertension Mother   ? Breast cancer Cousin   ? ? ?Social History  ? ?Socioeconomic History  ? Marital status: Married  ?  Spouse name: Not on file  ? Number of children: Not on file  ? Years of education: Not on file  ? Highest education level: Not on file  ?Occupational History  ? Not on file  ?Tobacco Use  ? Smoking status: Former  ?  Packs/day: 0.50  ?  Years: 20.00  ?  Pack years: 10.00  ?  Types: Cigarettes  ?  Quit date: 03/08/1993  ?  Years since quitting: 28.2  ? Smokeless tobacco: Never  ?Vaping Use  ? Vaping Use: Never used  ?Substance and Sexual Activity  ? Alcohol use: Yes  ?  Alcohol/week: 7.0 standard drinks  ?  Types: 7 Cans of beer per week  ?  Comment: occasionally  ? Drug use: No  ? Sexual activity: Not on file  ?Other Topics Concern  ? Not on file  ?Social History Narrative  ? Not on file  ? ?Social Determinants of Health  ? ?Financial Resource Strain: Low Risk   ? Difficulty of Paying  Living Expenses: Not very hard  ?Food Insecurity: Not on file  ?Transportation Needs: Not on file  ?Physical Activity: Not on file  ?Stress: Not on file  ?Social Connections: Not on file  ?Intimate Partner Violence: Not on file  ? ? ? ? ?Review of Systems  ?Constitutional:  Negative for chills, fatigue and unexpected weight change.  ?HENT:  Negative for congestion, rhinorrhea, sneezing and sore throat.   ?Eyes:  Negative for redness.  ?Respiratory:  Negative for cough, chest tightness and shortness of breath.   ?Cardiovascular:  Negative for chest pain and palpitations.  ?Gastrointestinal:  Negative for abdominal pain, constipation, diarrhea, nausea and vomiting.  ?Genitourinary:  Negative for dysuria and frequency.  ?Musculoskeletal:  Negative for arthralgias, back pain, joint swelling and neck pain.  ?Skin:  Negative for rash.  ?Neurological: Negative.  Negative for tremors and numbness.  ?Hematological:  Negative for adenopathy. Does not bruise/bleed easily.  ?Psychiatric/Behavioral:  Negative for behavioral problems (Depression), sleep disturbance and suicidal ideas. The patient is not nervous/anxious.   ? ?Vital Signs: ?BP (!) 142/70   Pulse 63   Temp 98.2 ?F (36.8 ?C)   Resp 16   Ht '5\' 4"'$  (1.626 m)   Wt 149 lb (67.6 kg)   SpO2 98%   BMI 25.58 kg/m?  ? ? ?Physical Exam ?Vitals reviewed.  ?Constitutional:   ?   General: She is not in acute distress. ?   Appearance: Normal appearance. She is not ill-appearing.  ?HENT:  ?   Head: Normocephalic and atraumatic.  ?Eyes:  ?   Pupils: Pupils are equal, round, and reactive to light.  ?Cardiovascular:  ?   Rate and Rhythm: Normal rate and regular rhythm.  ?Pulmonary:  ?   Effort: Pulmonary effort is normal. No respiratory distress.  ?Feet:  ?   Comments: Bilateral soles of feet are discolored a purplish-blue that is blanchable. The color deepens the longer she hangs her legs down while she is sitting in a chair.  ?Neurological:  ?   Mental Status: She is alert and  oriented to person, place, and time.  ?Psychiatric:     ?   Mood and Affect: Mood normal.     ?   Behavior: Behavior normal.  ? ? ? ? ? ?Assessment/Plan: ?1. Poor circulation of extremity ?Purplish-blue discoloration of soles of feet bilaterally still present with numbness and tingling. Vascular arterial ultrasound of bilateral extremities was normal with no stenosis or decreased blood flow noted.  ? ?2. Neuropathy of both feet ?Patient experiences neuropathy in the soles of her feet and is worse the longer she sits with her legs dependently hanging down and not elevated. Ultrasound was negative for PAD. ? ?3. Abnormal ankle brachial index (ABI) ?ABI was negative, ultrasound was normal.  ? ?4.  Essential hypertension ?Blood pressure is stable and well controlled.  ? ? ?General Counseling: Koraima verbalizes understanding of the findings of todays visit and agrees with plan of treatment. I have discussed any further diagnostic evaluation that may be needed or ordered today. We also reviewed her medications today. she has been encouraged to call the office with any questions or concerns that should arise related to todays visit. ? ? ? ?No orders of the defined types were placed in this encounter. ? ? ?No orders of the defined types were placed in this encounter. ? ? ?Return for will discuss next steps with Dr. Clayborn Bigness and then call the patient to let her know. ? ? ?Total time spent:30 Minutes ?Time spent includes review of chart, medications, test results, and follow up plan with the patient.  ? ?[place] Controlled Substance Database was reviewed by me. ? ?This patient was seen by [name], FNP-C in collaboration with Dr. Clayborn Bigness as a part of collaborative care agreement. ? ? ?Kamarius Buckbee R. Valetta Fuller, MSN, FNP-C ?Internal medicine  ?

## 2021-05-22 ENCOUNTER — Encounter: Payer: Self-pay | Admitting: Nurse Practitioner

## 2021-09-02 ENCOUNTER — Telehealth: Payer: PPO

## 2021-12-18 ENCOUNTER — Other Ambulatory Visit: Payer: Self-pay | Admitting: Nurse Practitioner

## 2021-12-18 DIAGNOSIS — F331 Major depressive disorder, recurrent, moderate: Secondary | ICD-10-CM

## 2022-04-08 ENCOUNTER — Ambulatory Visit: Payer: PPO | Admitting: Nurse Practitioner

## 2022-04-20 ENCOUNTER — Ambulatory Visit: Payer: PPO | Admitting: Nurse Practitioner

## 2022-04-27 ENCOUNTER — Other Ambulatory Visit: Payer: Self-pay | Admitting: Nurse Practitioner

## 2022-04-27 DIAGNOSIS — E119 Type 2 diabetes mellitus without complications: Secondary | ICD-10-CM | POA: Diagnosis not present

## 2022-04-27 DIAGNOSIS — E559 Vitamin D deficiency, unspecified: Secondary | ICD-10-CM | POA: Diagnosis not present

## 2022-04-28 LAB — LIPID PANEL
Chol/HDL Ratio: 2.6 ratio (ref 0.0–4.4)
Cholesterol, Total: 249 mg/dL — ABNORMAL HIGH (ref 100–199)
HDL: 97 mg/dL (ref >39)
LDL Chol Calc (NIH): 140 mg/dL — ABNORMAL HIGH (ref 0–99)
Triglycerides: 70 mg/dL (ref 0–149)
VLDL Cholesterol Cal: 12 mg/dL (ref 5–40)

## 2022-04-28 LAB — CMP14+EGFR
ALT: 22 IU/L (ref 0–32)
AST: 20 IU/L (ref 0–40)
Albumin/Globulin Ratio: 2 (ref 1.2–2.2)
Albumin: 4.8 g/dL (ref 3.8–4.8)
Alkaline Phosphatase: 110 IU/L (ref 44–121)
BUN/Creatinine Ratio: 12 (ref 12–28)
BUN: 9 mg/dL (ref 8–27)
Bilirubin Total: 0.6 mg/dL (ref 0.0–1.2)
CO2: 25 mmol/L (ref 20–29)
Calcium: 9.5 mg/dL (ref 8.7–10.3)
Chloride: 96 mmol/L (ref 96–106)
Creatinine, Ser: 0.73 mg/dL (ref 0.57–1.00)
Globulin, Total: 2.4 g/dL (ref 1.5–4.5)
Glucose: 146 mg/dL — ABNORMAL HIGH (ref 70–99)
Potassium: 4.2 mmol/L (ref 3.5–5.2)
Sodium: 136 mmol/L (ref 134–144)
Total Protein: 7.2 g/dL (ref 6.0–8.5)
eGFR: 86 mL/min/{1.73_m2} (ref >59)

## 2022-04-28 LAB — CBC WITH DIFFERENTIAL/PLATELET
Basophils Absolute: 0 10*3/uL (ref 0.0–0.2)
Basos: 1 %
EOS (ABSOLUTE): 0 10*3/uL (ref 0.0–0.4)
Eos: 1 %
Hematocrit: 40.7 % (ref 34.0–46.6)
Hemoglobin: 14 g/dL (ref 11.1–15.9)
Immature Grans (Abs): 0 10*3/uL (ref 0.0–0.1)
Immature Granulocytes: 0 %
Lymphocytes Absolute: 1.7 10*3/uL (ref 0.7–3.1)
Lymphs: 30 %
MCH: 33.2 pg — ABNORMAL HIGH (ref 26.6–33.0)
MCHC: 34.4 g/dL (ref 31.5–35.7)
MCV: 96 fL (ref 79–97)
Monocytes Absolute: 0.4 10*3/uL (ref 0.1–0.9)
Monocytes: 8 %
Neutrophils Absolute: 3.5 10*3/uL (ref 1.4–7.0)
Neutrophils: 60 %
Platelets: 228 10*3/uL (ref 150–450)
RBC: 4.22 x10E6/uL (ref 3.77–5.28)
RDW: 11.7 % (ref 11.7–15.4)
WBC: 5.8 10*3/uL (ref 3.4–10.8)

## 2022-04-28 LAB — TSH+FREE T4
Free T4: 1.14 ng/dL (ref 0.82–1.77)
TSH: 0.876 u[IU]/mL (ref 0.450–4.500)

## 2022-04-28 LAB — VITAMIN D 25 HYDROXY (VIT D DEFICIENCY, FRACTURES): Vit D, 25-Hydroxy: 23.5 ng/mL — ABNORMAL LOW (ref 30.0–100.0)

## 2022-04-28 NOTE — Progress Notes (Signed)
Will discuss results at upcoming office visit on 05/11/22

## 2022-05-10 DIAGNOSIS — Z1212 Encounter for screening for malignant neoplasm of rectum: Secondary | ICD-10-CM | POA: Diagnosis not present

## 2022-05-10 DIAGNOSIS — Z1211 Encounter for screening for malignant neoplasm of colon: Secondary | ICD-10-CM | POA: Diagnosis not present

## 2022-05-11 ENCOUNTER — Encounter: Payer: Self-pay | Admitting: Nurse Practitioner

## 2022-05-11 ENCOUNTER — Ambulatory Visit (INDEPENDENT_AMBULATORY_CARE_PROVIDER_SITE_OTHER): Payer: PPO | Admitting: Nurse Practitioner

## 2022-05-11 VITALS — BP 130/85 | HR 73 | Temp 98.2°F | Resp 16 | Ht 64.0 in | Wt 148.2 lb

## 2022-05-11 DIAGNOSIS — I10 Essential (primary) hypertension: Secondary | ICD-10-CM | POA: Diagnosis not present

## 2022-05-11 DIAGNOSIS — Z1212 Encounter for screening for malignant neoplasm of rectum: Secondary | ICD-10-CM | POA: Diagnosis not present

## 2022-05-11 DIAGNOSIS — F331 Major depressive disorder, recurrent, moderate: Secondary | ICD-10-CM

## 2022-05-11 DIAGNOSIS — Z0001 Encounter for general adult medical examination with abnormal findings: Secondary | ICD-10-CM | POA: Diagnosis not present

## 2022-05-11 DIAGNOSIS — Z1211 Encounter for screening for malignant neoplasm of colon: Secondary | ICD-10-CM

## 2022-05-11 DIAGNOSIS — R300 Dysuria: Secondary | ICD-10-CM

## 2022-05-11 DIAGNOSIS — F419 Anxiety disorder, unspecified: Secondary | ICD-10-CM | POA: Diagnosis not present

## 2022-05-11 DIAGNOSIS — Z1231 Encounter for screening mammogram for malignant neoplasm of breast: Secondary | ICD-10-CM | POA: Diagnosis not present

## 2022-05-11 DIAGNOSIS — D229 Melanocytic nevi, unspecified: Secondary | ICD-10-CM | POA: Diagnosis not present

## 2022-05-11 MED ORDER — ATENOLOL 25 MG PO TABS: ORAL_TABLET | ORAL | 1 refills | Status: DC

## 2022-05-11 MED ORDER — ALPRAZOLAM 0.25 MG PO TABS: 0.2500 mg | ORAL_TABLET | Freq: Two times a day (BID) | ORAL | 0 refills | Status: DC | PRN

## 2022-05-11 MED ORDER — FLUTICASONE PROPIONATE 50 MCG/ACT NA SUSP: NASAL | 11 refills | Status: AC

## 2022-05-11 MED ORDER — VENLAFAXINE HCL ER 75 MG PO CP24: ORAL_CAPSULE | ORAL | 1 refills | Status: DC

## 2022-05-11 NOTE — Progress Notes (Signed)
[facility] [place], [place] [address]  Internal MEDICINE  Office Visit Note  Patient Name: Bonnie Washington  [id number]  [id number]  Date of Service: 05/11/2022  Chief Complaint  Patient presents with   Depression   Diabetes   Hypertension    HPI Zaylyn presents for an annual well visit and physical exam.  Well-appearing 77 y.o. female with hypertension, prediabetes, low vitamin D and depression.  Routine CRC screening: due for cologuard Routine mammogram: due for mammogram Labs: discussed results with patient  Low vitamin D Elevated LDL, 140 and HDL 97 New or worsening pain: none  Other concerns: Small mole has appeared on the lateral side of her right breast in the past couple of months, the patient thinks that it has gotten bigger, it is dark, black, and asymmetrical. It is not tender and does not hurt.  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      05/11/2022    3:07 PM 04/16/2021    2:01 PM 02/18/2020   10:18 AM  MMSE - Mini Mental State Exam  Orientation to time '5 5 5  '$ Orientation to Place '5 5 5  '$ Registration '3 3 3  '$ Attention/ Calculation '5 5 5  '$ Recall '3 3 3  '$ Language- name 2 objects '2 2 2  '$ Language- repeat '1 1 1  '$ Language- follow 3 step command '3 3 3  '$ Language- read & follow direction '1 1 1  '$ Write a sentence '1 1 1  '$ Copy design '1 1 1  '$ Total score '30 30 30    '$ Functional Status Survey: Is the patient deaf or have difficulty hearing?: Yes (sometimes) Does the patient have difficulty seeing, even when wearing glasses/contacts?: No Does the patient have difficulty concentrating, remembering, or making decisions?: No Does the patient have difficulty walking or climbing stairs?: No Does the patient have difficulty dressing or bathing?: No Does the patient have difficulty doing errands alone such as visiting a doctor's office or shopping?: No     02/18/2020    10:16 AM 03/10/2020    4:04 PM 11/19/2020    3:49 PM 04/16/2021    1:59 PM 05/11/2022    3:05 PM  [place] in the past year? 1 1 0 0 0  Was there an injury with Fall? 1 0   0  Was there an injury with Fall? - Comments hit head      Fall Risk Category Calculator 2 1   0  Fall Risk Category (Retired) Moderate Low     (RETIRED) Patient Fall Risk Level   Low fall risk Low fall risk   Patient at Risk for Falls Due to   No Fall Risks No Fall Risks No Fall Risks  Fall risk Follow up Falls evaluation completed  Falls evaluation completed Falls evaluation completed Falls evaluation completed       05/11/2022    3:05 PM  Depression screen PHQ 2/9  Decreased Interest 0  Down, Depressed, Hopeless 0  PHQ - 2 Score 0        Current Medication: Outpatient Encounter Medications as of 05/11/2022  Medication Sig   ALPRAZolam (XANAX) 0.25 MG tablet Take 1 tablet (0.25 mg total) by mouth 2 (two) times daily as needed for anxiety.   aspirin EC 81 MG tablet Take 1 tablet (81 mg total) by mouth daily.   [DISCONTINUED] atenolol (TENORMIN) 25 MG tablet Take  1/2 tablet po QPM   [DISCONTINUED] fluticasone (FLONASE) 50 MCG/ACT nasal spray PLACE 1 SPRAY IN EACH NOSTRIL AT BEDTIME   [DISCONTINUED] meloxicam (MOBIC) 15 MG tablet Take 15 mg by mouth daily.   [DISCONTINUED] venlafaxine XR (EFFEXOR-XR) 75 MG 24 hr capsule TAKE THREE CAPSULES BY MOUTH ONCE DAILY   atenolol (TENORMIN) 25 MG tablet Take 1/2 tablet po QPM   fluticasone (FLONASE) 50 MCG/ACT nasal spray PLACE 1 SPRAY IN EACH NOSTRIL AT BEDTIME   venlafaxine XR (EFFEXOR-XR) 75 MG 24 hr capsule TAKE THREE CAPSULES BY MOUTH ONCE DAILY   No facility-administered encounter medications on file as of 05/11/2022.    Surgical History: Past Surgical History:  Procedure Laterality Date   CHOLECYSTECTOMY  2014   FRACTURE SURGERY Right 2004   elbow   KNEE ARTHROSCOPY WITH MEDIAL MENISECTOMY Left 09/21/2018   Procedure: KNEE ARTHROSCOPY WITH MEDIAL AND  LATERAL MENISECTOMY, SYNOVECTOMY, CHONDROPLASTY;  Surgeon: Lovell Sheehan, MD;  Location: [facility];  Service: Orthopedics;  Laterality: Left;  Diabetes-diet controlled   OPEN REDUCTION INTERNAL FIXATION (ORIF) DISTAL RADIAL FRACTURE Left 10/06/2015   Procedure: OPEN REDUCTION INTERNAL FIXATION (ORIF) DISTAL RADIAL FRACTURE;  Surgeon: Earnestine Leys, MD;  Location: [facility];  Service: Orthopedics;  Laterality: Left;   VENTRAL HERNIA REPAIR N/A 06/10/2017   Procedure: HERNIA REPAIR VENTRAL ADULT;  Surgeon: Robert Bellow, MD;  Location: [facility];  Service: General;  Laterality: N/A;    Medical History: Past Medical History:  Diagnosis Date   Depression    Diabetes mellitus without complication ([place])    pt use to take metformin but has not taken it in 34yr/lost weight/ stopped metformin/ watches diet   Hypertension    controlled on meds    Family History: Family History  Problem Relation Age of Onset   Hypertension Mother    Breast cancer Cousin     Social History   Socioeconomic History   Marital status: Married    Spouse name: Not on file   Number of children: Not on file   Years of education: Not on file   Highest education level: Not on file  Occupational History   Not on file  Tobacco Use   Smoking status: Former    Packs/day: 0.50    Years: 20.00    Total pack years: 10.00    Types: Cigarettes    Quit date: 03/08/1993    Years since quitting: 29.1   Smokeless tobacco: Never  Vaping Use   Vaping Use: Never used  Substance and Sexual Activity   Alcohol use: Yes    Alcohol/week: 7.0 standard drinks of alcohol    Types: 7 Cans of beer per week    Comment: occasionally   Drug use: No   Sexual activity: Not on file  Other Topics Concern   Not on file  Social History Narrative   Not on file   Social Determinants of Health   Financial Resource Strain: Low Risk  (08/12/2020)   Overall Financial Resource Strain (CARDIA)    Difficulty of Paying Living  Expenses: Not very hard  Food Insecurity: Not on file  Transportation Needs: Not on file  Physical Activity: Not on file  Stress: Not on file  Social Connections: Not on file  Intimate Partner Violence: Not on file      Review of Systems  Constitutional:  Negative for activity change, appetite change, chills, fatigue, fever and unexpected weight change.  HENT: Negative.  Negative for congestion, ear pain, rhinorrhea, sore  throat and trouble swallowing.   Eyes: Negative.   Respiratory: Negative.  Negative for cough, chest tightness, shortness of breath and wheezing.   Cardiovascular: Negative.  Negative for chest pain.  Gastrointestinal: Negative.  Negative for abdominal pain, blood in stool, constipation, diarrhea, nausea and vomiting.  Endocrine: Negative.   Genitourinary: Negative.  Negative for difficulty urinating, dysuria, frequency, hematuria and urgency.  Musculoskeletal: Negative.  Negative for arthralgias, back pain, joint swelling, myalgias and neck pain.  Skin: Negative.  Negative for rash and wound.  Allergic/Immunologic: Negative.  Negative for immunocompromised state.  Neurological: Negative.  Negative for dizziness, seizures, numbness and headaches.  Hematological: Negative.   Psychiatric/Behavioral:  Negative for behavioral problems, self-injury, sleep disturbance and suicidal ideas. The patient is nervous/anxious.     Vital Signs: BP 130/85 Comment: 182/72  Pulse 73   Temp 98.2 F (36.8 C)   Resp 16   Ht '5\' 4"'$  (1.626 m)   Wt 148 lb 3.2 oz (67.2 kg)   SpO2 95%   BMI 25.44 kg/m    Physical Exam Vitals reviewed.  Constitutional:      General: She is awake. She is not in acute distress.    Appearance: Normal appearance. She is well-developed, well-groomed and normal weight. She is not ill-appearing or diaphoretic.  HENT:     Head: Normocephalic and atraumatic.     Right Ear: Tympanic membrane, ear canal and external ear normal.     Left Ear: Tympanic  membrane, ear canal and external ear normal.     Nose: Nose normal. No congestion or rhinorrhea.     Mouth/Throat:     Lips: Pink.     Mouth: Mucous membranes are moist.     Pharynx: Oropharynx is clear. Uvula midline. No oropharyngeal exudate or posterior oropharyngeal erythema.  Eyes:     General: Lids are normal. Vision grossly intact. Gaze aligned appropriately. No scleral icterus.       Right eye: No discharge.        Left eye: No discharge.     Extraocular Movements: Extraocular movements intact.     Conjunctiva/sclera: Conjunctivae normal.     Pupils: Pupils are equal, round, and reactive to light.     Funduscopic exam:    Right eye: Red reflex present.        Left eye: Red reflex present. Neck:     Thyroid: No thyromegaly.     Vascular: No JVD.     Trachea: Trachea and phonation normal. No tracheal deviation.  Cardiovascular:     Rate and Rhythm: Normal rate and regular rhythm.     Pulses: Normal pulses.     Heart sounds: Normal heart sounds, S1 normal and S2 normal. No murmur heard.    No friction rub. No gallop.  Pulmonary:     Effort: Pulmonary effort is normal. No accessory muscle usage or respiratory distress.     Breath sounds: Normal breath sounds and air entry. No stridor. No wheezing or rales.  Chest:     Chest wall: No tenderness.       Comments: Declined breast exam, patient will get mammogram done.   Does have a mole on the lateral side of the right breast that is black, asymmetrical and has been growing per patient. It is 1 cm in diameter. The edges look ill-defined in some areas  Abdominal:     General: Bowel sounds are normal. There is no distension.     Palpations: Abdomen is soft. There is no  shifting dullness, fluid wave, mass or pulsatile mass.     Tenderness: There is no abdominal tenderness. There is no guarding or rebound.  Musculoskeletal:        General: No tenderness or deformity. Normal range of motion.     Cervical back: Normal range of  motion and neck supple.     Right lower leg: No edema.     Left lower leg: No edema.  Lymphadenopathy:     Cervical: No cervical adenopathy.  Skin:    General: Skin is warm and dry.     Capillary Refill: Capillary refill takes less than 2 seconds.     Coloration: Skin is not pale.     Findings: No erythema or rash.  Neurological:     Mental Status: She is alert and oriented to person, place, and time.     Cranial Nerves: No cranial nerve deficit.     Motor: No abnormal muscle tone.     Coordination: Coordination normal.     Deep Tendon Reflexes: Reflexes are normal and symmetric.  Psychiatric:        Mood and Affect: Mood and affect normal.        Behavior: Behavior normal. Behavior is cooperative.        Thought Content: Thought content normal.        Judgment: Judgment normal.        Assessment/Plan: 1. Encounter for routine adult health examination with abnormal findings Age-appropriate preventive screenings and vaccinations discussed, annual physical exam completed. Routine labs for health maintenance done recently, results discussed with patient today.Marland Kitchen PHM updated.  - fluticasone (FLONASE) 50 MCG/ACT nasal spray; PLACE 1 SPRAY IN EACH NOSTRIL AT BEDTIME  Dispense: 16 g; Refill: 11  2. Essential hypertension Stable, continue atenolol as prescribed.  - atenolol (TENORMIN) 25 MG tablet; Take 1/2 tablet po QPM  Dispense: 90 tablet; Refill: 1  3. Atypical mole Small mole on lateral side of right breast, due to dark color asymmetry and recent growth, concern for malignancy, please evaluate and treat.  - Ambulatory referral to Dermatology  4. Dysuria Routine urinalysis done - UA/M w/rflx Culture, Routine  5. Encounter for screening mammogram for malignant neoplasm of breast Routine mammogram ordered - MM 3D SCREENING MAMMOGRAM BILATERAL BREAST; Future  6. Screening for colorectal cancer Overdue for cologuard, test kit ordered - Cologuard  7. Moderate recurrent major  depression (HCC) Continue venlafaxine as prescribed.  - venlafaxine XR (EFFEXOR-XR) 75 MG 24 hr capsule; TAKE THREE CAPSULES BY MOUTH ONCE DAILY  Dispense: 270 capsule; Refill: 1  8. Anxiety Continue venlafaxine as prescribed. Small amount of prn alprazolam added for times when patient is experiencing severe anxiety, agitation or stress related to her role as caregiver to her husband.  - venlafaxine XR (EFFEXOR-XR) 75 MG 24 hr capsule; TAKE THREE CAPSULES BY MOUTH ONCE DAILY  Dispense: 270 capsule; Refill: 1 - ALPRAZolam (XANAX) 0.25 MG tablet; Take 1 tablet (0.25 mg total) by mouth 2 (two) times daily as needed for anxiety.  Dispense: 30 tablet; Refill: 0      General Counseling: Alma verbalizes understanding of the findings of todays visit and agrees with plan of treatment. I have discussed any further diagnostic evaluation that may be needed or ordered today. We also reviewed her medications today. she has been encouraged to call the office with any questions or concerns that should arise related to todays visit.    Orders Placed This Encounter  Procedures   MM 3D SCREENING MAMMOGRAM  BILATERAL BREAST   UA/M w/rflx Culture, Routine   Cologuard   Ambulatory referral to Dermatology    Meds ordered this encounter  Medications   venlafaxine XR (EFFEXOR-XR) 75 MG 24 hr capsule    Sig: TAKE THREE CAPSULES BY MOUTH ONCE DAILY    Dispense:  270 capsule    Refill:  1   fluticasone (FLONASE) 50 MCG/ACT nasal spray    Sig: PLACE 1 SPRAY IN EACH NOSTRIL AT BEDTIME    Dispense:  16 g    Refill:  11    Fill for 90 days   atenolol (TENORMIN) 25 MG tablet    Sig: Take 1/2 tablet po QPM    Dispense:  90 tablet    Refill:  1   ALPRAZolam (XANAX) 0.25 MG tablet    Sig: Take 1 tablet (0.25 mg total) by mouth 2 (two) times daily as needed for anxiety.    Dispense:  30 tablet    Refill:  0    Return in about 1 year (around 05/11/2023) for CPE, AWV, Docia Klar PCP.   Total time spent:30  Minutes Time spent includes review of chart, medications, test results, and follow up plan with the patient.   [place] Controlled Substance Database was reviewed by me.  This patient was seen by [name], FNP-C in collaboration with Dr. Clayborn Bigness as a part of collaborative care agreement.  Amberlie Gaillard R. Valetta Fuller, MSN, FNP-C Internal medicine

## 2022-05-12 ENCOUNTER — Encounter: Payer: Self-pay | Admitting: Nurse Practitioner

## 2022-05-12 LAB — UA/M W/RFLX CULTURE, ROUTINE
Bilirubin, UA: NEGATIVE
Glucose, UA: NEGATIVE
Ketones, UA: NEGATIVE
Leukocytes,UA: NEGATIVE
Nitrite, UA: NEGATIVE
Protein,UA: NEGATIVE
RBC, UA: NEGATIVE
Specific Gravity, UA: <=1.005 — AB (ref 1.005–1.030)
Urobilinogen, Ur: 0.2 mg/dL (ref 0.2–1.0)
pH, UA: 6.5 (ref 5.0–7.5)

## 2022-05-12 LAB — MICROSCOPIC EXAMINATION
Bacteria, UA: NONE SEEN
Casts: NONE SEEN /lpf
Epithelial Cells (non renal): NONE SEEN /hpf (ref 0–10)
RBC, Urine: NONE SEEN /hpf (ref 0–2)
WBC, UA: NONE SEEN /hpf (ref 0–5)

## 2022-05-14 DIAGNOSIS — D485 Neoplasm of uncertain behavior of skin: Secondary | ICD-10-CM | POA: Diagnosis not present

## 2022-05-14 DIAGNOSIS — D1801 Hemangioma of skin and subcutaneous tissue: Secondary | ICD-10-CM | POA: Diagnosis not present

## 2022-05-14 DIAGNOSIS — L298 Other pruritus: Secondary | ICD-10-CM | POA: Diagnosis not present

## 2022-05-18 LAB — COLOGUARD: COLOGUARD: NEGATIVE

## 2022-05-18 NOTE — Progress Notes (Signed)
Please let patient know that her cologuard test was negative, will repeat in 3 years.

## 2022-05-19 ENCOUNTER — Telehealth: Payer: Self-pay | Admitting: Nurse Practitioner

## 2022-05-19 ENCOUNTER — Telehealth: Payer: Self-pay

## 2022-05-19 NOTE — Telephone Encounter (Signed)
Left message for patient to give office a call back.  

## 2022-05-19 NOTE — Telephone Encounter (Signed)
-----   Message from [name], NP sent at 05/18/2022  9:25 PM EDT ----- Please let patient know that her cologuard test was negative, will repeat in 3 years.

## 2022-05-19 NOTE — Telephone Encounter (Signed)
Patient notified

## 2022-05-19 NOTE — Telephone Encounter (Signed)
Dermatology referral sent via [facility] to Dr. Isenstein with [facility] 

## 2022-05-20 ENCOUNTER — Telehealth: Payer: Self-pay | Admitting: Nurse Practitioner

## 2022-05-20 NOTE — Telephone Encounter (Signed)
Dermatology appointment>> 05/14/22 with [facility]

## 2022-05-27 ENCOUNTER — Ambulatory Visit
Admission: RE | Admit: 2022-05-27 | Discharge: 2022-05-27 | Disposition: A | Discharge status: Home / Self Care | Payer: PPO | Source: Ambulatory Visit | Attending: Nurse Practitioner | Admitting: Nurse Practitioner

## 2022-05-27 DIAGNOSIS — Z1231 Encounter for screening mammogram for malignant neoplasm of breast: Secondary | ICD-10-CM | POA: Diagnosis not present

## 2022-11-04 ENCOUNTER — Other Ambulatory Visit: Payer: Self-pay

## 2022-11-04 DIAGNOSIS — F331 Major depressive disorder, recurrent, moderate: Secondary | ICD-10-CM

## 2022-11-04 DIAGNOSIS — F419 Anxiety disorder, unspecified: Secondary | ICD-10-CM

## 2022-11-04 MED ORDER — VENLAFAXINE HCL ER 75 MG PO CP24: ORAL_CAPSULE | ORAL | 0 refills | Status: DC

## 2023-03-11 ENCOUNTER — Other Ambulatory Visit: Payer: Self-pay | Admitting: Nurse Practitioner

## 2023-03-11 DIAGNOSIS — F419 Anxiety disorder, unspecified: Secondary | ICD-10-CM

## 2023-03-11 DIAGNOSIS — F331 Major depressive disorder, recurrent, moderate: Secondary | ICD-10-CM

## 2023-03-11 NOTE — Telephone Encounter (Signed)
 Ok to send

## 2023-04-18 DIAGNOSIS — C44722 Squamous cell carcinoma of skin of right lower limb, including hip: Secondary | ICD-10-CM | POA: Diagnosis not present

## 2023-04-18 DIAGNOSIS — D2272 Melanocytic nevi of left lower limb, including hip: Secondary | ICD-10-CM | POA: Diagnosis not present

## 2023-04-18 DIAGNOSIS — D2262 Melanocytic nevi of left upper limb, including shoulder: Secondary | ICD-10-CM | POA: Diagnosis not present

## 2023-04-18 DIAGNOSIS — L570 Actinic keratosis: Secondary | ICD-10-CM | POA: Diagnosis not present

## 2023-04-18 DIAGNOSIS — D040 Carcinoma in situ of skin of lip: Secondary | ICD-10-CM | POA: Diagnosis not present

## 2023-04-18 DIAGNOSIS — D225 Melanocytic nevi of trunk: Secondary | ICD-10-CM | POA: Diagnosis not present

## 2023-04-18 DIAGNOSIS — L821 Other seborrheic keratosis: Secondary | ICD-10-CM | POA: Diagnosis not present

## 2023-04-18 DIAGNOSIS — D2261 Melanocytic nevi of right upper limb, including shoulder: Secondary | ICD-10-CM | POA: Diagnosis not present

## 2023-04-18 DIAGNOSIS — D485 Neoplasm of uncertain behavior of skin: Secondary | ICD-10-CM | POA: Diagnosis not present

## 2023-04-18 DIAGNOSIS — D2271 Melanocytic nevi of right lower limb, including hip: Secondary | ICD-10-CM | POA: Diagnosis not present

## 2023-04-18 DIAGNOSIS — D0462 Carcinoma in situ of skin of left upper limb, including shoulder: Secondary | ICD-10-CM | POA: Diagnosis not present

## 2023-05-06 ENCOUNTER — Telehealth: Payer: Self-pay | Admitting: Nurse Practitioner

## 2023-05-06 NOTE — Telephone Encounter (Signed)
 VM full, sent mychart message to confirm 05/13/23 appointment-Bonnie Washington

## 2023-05-13 ENCOUNTER — Ambulatory Visit: Payer: PPO | Admitting: Nurse Practitioner

## 2023-05-23 DIAGNOSIS — C44722 Squamous cell carcinoma of skin of right lower limb, including hip: Secondary | ICD-10-CM | POA: Diagnosis not present

## 2023-06-10 ENCOUNTER — Other Ambulatory Visit: Payer: Self-pay | Admitting: Nurse Practitioner

## 2023-06-10 DIAGNOSIS — F419 Anxiety disorder, unspecified: Secondary | ICD-10-CM

## 2023-06-10 DIAGNOSIS — F331 Major depressive disorder, recurrent, moderate: Secondary | ICD-10-CM

## 2023-06-13 DIAGNOSIS — C44729 Squamous cell carcinoma of skin of left lower limb, including hip: Secondary | ICD-10-CM | POA: Diagnosis not present

## 2023-06-13 DIAGNOSIS — L905 Scar conditions and fibrosis of skin: Secondary | ICD-10-CM | POA: Diagnosis not present

## 2023-06-20 ENCOUNTER — Ambulatory Visit (INDEPENDENT_AMBULATORY_CARE_PROVIDER_SITE_OTHER): Payer: PPO | Admitting: Nurse Practitioner

## 2023-06-20 ENCOUNTER — Encounter: Payer: Self-pay | Admitting: Nurse Practitioner

## 2023-06-20 ENCOUNTER — Telehealth: Payer: Self-pay | Admitting: Nurse Practitioner

## 2023-06-20 VITALS — BP 140/68 | HR 60 | Temp 98.8°F | Resp 16 | Ht 63.0 in | Wt 138.0 lb

## 2023-06-20 DIAGNOSIS — I10 Essential (primary) hypertension: Secondary | ICD-10-CM

## 2023-06-20 DIAGNOSIS — Z1212 Encounter for screening for malignant neoplasm of rectum: Secondary | ICD-10-CM

## 2023-06-20 DIAGNOSIS — F419 Anxiety disorder, unspecified: Secondary | ICD-10-CM

## 2023-06-20 DIAGNOSIS — F331 Major depressive disorder, recurrent, moderate: Secondary | ICD-10-CM | POA: Diagnosis not present

## 2023-06-20 DIAGNOSIS — Z1211 Encounter for screening for malignant neoplasm of colon: Secondary | ICD-10-CM | POA: Diagnosis not present

## 2023-06-20 DIAGNOSIS — H9193 Unspecified hearing loss, bilateral: Secondary | ICD-10-CM | POA: Diagnosis not present

## 2023-06-20 DIAGNOSIS — Z0000 Encounter for general adult medical examination without abnormal findings: Secondary | ICD-10-CM | POA: Diagnosis not present

## 2023-06-20 DIAGNOSIS — Z1231 Encounter for screening mammogram for malignant neoplasm of breast: Secondary | ICD-10-CM | POA: Diagnosis not present

## 2023-06-20 MED ORDER — ATENOLOL 25 MG PO TABS: ORAL_TABLET | ORAL | 3 refills | Status: DC

## 2023-06-20 MED ORDER — VENLAFAXINE HCL ER 75 MG PO CP24: ORAL_CAPSULE | ORAL | 3 refills | Status: AC

## 2023-06-20 NOTE — Progress Notes (Signed)
 [facility] [address] [place], [place] [address]  Internal MEDICINE  Office Visit Note  Patient Name: Bonnie Washington  [id number]  [id number]  Date of Service: 06/20/2023  Chief Complaint  Patient presents with   Depression   Diabetes   Hypertension   Medicare Wellness    HPI Bonnie Washington presents for an annual well visit and physical exam.  Well-appearing 78 y.o. female with hypertension, prediabetes, depression and anxiety.  Routine CRC screening: due for cologuard  Routine mammogram: due now  DEXA scan: declined, last done in 2019 with osteopenia of spine and femur.  Labs: due for routine labs  New or worsening pain: none  Other concerns:     06/20/2023    2:36 PM 05/11/2022    3:07 PM 04/16/2021    2:01 PM  MMSE - Mini Mental State Exam  Orientation to time 5 5 5   Orientation to Place 5 5 5   Registration 3 3 3   Attention/ Calculation 5 5 5   Recall 3 3 3   Language- name 2 objects 2 2 2   Language- repeat 1 1 1   Language- follow 3 step command 3 3 3   Language- read & follow direction 1 1 1   Write a sentence 1 1 1   Copy design 1 1 1   Total score 30 30 30     Functional Status Survey: Is the patient deaf or have difficulty hearing?: Yes Does the patient have difficulty seeing, even when wearing glasses/contacts?: No Does the patient have difficulty concentrating, remembering, or making decisions?: No Does the patient have difficulty walking or climbing stairs?: No Does the patient have difficulty dressing or bathing?: No Does the patient have difficulty doing errands alone such as visiting a doctor's office or shopping?: No     03/10/2020    4:04 PM 11/19/2020    3:49 PM 04/16/2021    1:59 PM 05/11/2022    3:05 PM 06/20/2023    2:36 PM  Fall Risk  Falls in the past year? 1 0 0 0 0  Was there an injury with Fall? 0   0 0  Fall Risk Category Calculator 1   0 0  Fall Risk Category (Retired) Low      (RETIRED) Patient Fall Risk Level  Low fall risk Low fall risk     Patient at Risk for Falls Due to  No Fall Risks No Fall Risks No Fall Risks No Fall Risks  Fall risk Follow up  Falls evaluation completed Falls evaluation completed Falls evaluation completed Falls evaluation completed       05/11/2022    3:05 PM  Depression screen PHQ 2/9  Decreased Interest 0  Down, Depressed, Hopeless 0  PHQ - 2 Score 0        No data to display            Current Medication: Outpatient Encounter Medications as of 06/20/2023  Medication Sig   ALPRAZolam (XANAX) 0.25 MG tablet Take 1 tablet (0.25 mg total) by mouth 2 (two) times daily as needed for anxiety.   aspirin EC 81 MG tablet Take 1 tablet (81 mg total) by mouth daily.   fluticasone (FLONASE) 50 MCG/ACT nasal spray PLACE 1 SPRAY IN EACH NOSTRIL AT BEDTIME   [DISCONTINUED] atenolol (TENORMIN) 25 MG tablet Take 1/2 tablet po QPM   [DISCONTINUED] venlafaxine XR (EFFEXOR-XR) 75 MG 24 hr capsule TAKE THREE CAPSULES BY MOUTH ONCE DAILY   atenolol (TENORMIN) 25 MG tablet Take 1/2 tablet po QPM  venlafaxine XR (EFFEXOR-XR) 75 MG 24 hr capsule TAKE THREE CAPSULES BY MOUTH ONCE DAILY   No facility-administered encounter medications on file as of 06/20/2023.    Surgical History: Past Surgical History:  Procedure Laterality Date   CHOLECYSTECTOMY  2014   FRACTURE SURGERY Right 2004   elbow   KNEE ARTHROSCOPY WITH MEDIAL MENISECTOMY Left 09/21/2018   Procedure: KNEE ARTHROSCOPY WITH MEDIAL AND LATERAL MENISECTOMY, SYNOVECTOMY, CHONDROPLASTY;  Surgeon: Jerlyn Moons, MD;  Location: [facility];  Service: Orthopedics;  Laterality: Left;  Diabetes-diet controlled   OPEN REDUCTION INTERNAL FIXATION (ORIF) DISTAL RADIAL FRACTURE Left 10/06/2015   Procedure: OPEN REDUCTION INTERNAL FIXATION (ORIF) DISTAL RADIAL FRACTURE;  Surgeon: Marlynn Singer, MD;  Location: [facility];  Service: Orthopedics;  Laterality: Left;   VENTRAL HERNIA REPAIR N/A 06/10/2017   Procedure: HERNIA REPAIR VENTRAL ADULT;  Surgeon:  Marshall Skeeter, MD;  Location: [facility];  Service: General;  Laterality: N/A;    Medical History: Past Medical History:  Diagnosis Date   Depression    Diabetes mellitus without complication (HCC)    pt use to take metformin but has not taken it in 39yrs/lost weight/ stopped metformin/ watches diet   Hypertension    controlled on meds    Family History: Family History  Problem Relation Age of Onset   Hypertension Mother    Breast cancer Cousin     Social History   Socioeconomic History   Marital status: Married    Spouse name: Not on file   Number of children: Not on file   Years of education: Not on file   Highest education level: Not on file  Occupational History   Not on file  Tobacco Use   Smoking status: Former    Current packs/day: 0.00    Average packs/day: 0.5 packs/day for 20.0 years (10.0 ttl pk-yrs)    Types: Cigarettes    Start date: 03/08/1973    Quit date: 03/08/1993    Years since quitting: 30.3   Smokeless tobacco: Never  Vaping Use   Vaping status: Never Used  Substance and Sexual Activity   Alcohol use: Yes    Alcohol/week: 7.0 standard drinks of alcohol    Types: 7 Cans of beer per week    Comment: occasionally   Drug use: No   Sexual activity: Not on file  Other Topics Concern   Not on file  Social History Narrative   Not on file   Social Drivers of Health   Financial Resource Strain: Low Risk  (08/12/2020)   Overall Financial Resource Strain (CARDIA)    Difficulty of Paying Living Expenses: Not very hard  Food Insecurity: Not on file  Transportation Needs: Not on file  Physical Activity: Not on file  Stress: Not on file  Social Connections: Not on file  Intimate Partner Violence: Not on file      Review of Systems  Vital Signs: BP (!) 140/68   Pulse 60   Temp 98.8 F (37.1 C)   Resp 16   Ht 5\' 3"  (1.6 m)   Wt 138 lb (62.6 kg)   SpO2 96%   BMI 24.45 kg/m    Physical Exam     Assessment/Plan: 1. Screening for  colorectal cancer (Primary) - Cologuard  2. Encounter for subsequent annual wellness visit (AWV) in Medicare patient  3. Encounter for screening mammogram for malignant neoplasm of breast - MM 3D SCREENING MAMMOGRAM BILATERAL BREAST; Future  4. Essential hypertension - atenolol (TENORMIN) 25 MG  tablet; Take 1/2 tablet po QPM  Dispense: 90 tablet; Refill: 3  5. Moderate recurrent major depression (HCC) - venlafaxine XR (EFFEXOR-XR) 75 MG 24 hr capsule; TAKE THREE CAPSULES BY MOUTH ONCE DAILY  Dispense: 270 capsule; Refill: 3  6. Anxiety - venlafaxine XR (EFFEXOR-XR) 75 MG 24 hr capsule; TAKE THREE CAPSULES BY MOUTH ONCE DAILY  Dispense: 270 capsule; Refill: 3     General Counseling: Bonnie Washington verbalizes understanding of the findings of todays visit and agrees with plan of treatment. I have discussed any further diagnostic evaluation that may be needed or ordered today. We also reviewed her medications today. she has been encouraged to call the office with any questions or concerns that should arise related to todays visit.    Orders Placed This Encounter  Procedures   MM 3D SCREENING MAMMOGRAM BILATERAL BREAST   Cologuard    Meds ordered this encounter  Medications   atenolol (TENORMIN) 25 MG tablet    Sig: Take 1/2 tablet po QPM    Dispense:  90 tablet    Refill:  3    For future refills   venlafaxine XR (EFFEXOR-XR) 75 MG 24 hr capsule    Sig: TAKE THREE CAPSULES BY MOUTH ONCE DAILY    Dispense:  270 capsule    Refill:  3    For future refills    Return in about 1 year (around 06/19/2024) for AWV, Bonnie Washington PCP and otherwise as needed .   Total time spent:*** Minutes Time spent includes review of chart, medications, test results, and follow up plan with the patient.   [place] Controlled Substance Database was reviewed by me.  This patient was seen by Laurence Pons, FNP-C in collaboration with Dr. Verneta Gone as a part of collaborative care agreement.  Bonnie Kronk R. Bobbi Burow,  MSN, FNP-C Internal medicine

## 2023-06-20 NOTE — Telephone Encounter (Signed)
 Awaiting 06/20/23 office notes for Audiology referral-[facility]

## 2023-06-22 ENCOUNTER — Telehealth: Payer: Self-pay | Admitting: Nurse Practitioner

## 2023-06-22 ENCOUNTER — Encounter: Payer: Self-pay | Admitting: Nurse Practitioner

## 2023-06-22 DIAGNOSIS — H9193 Unspecified hearing loss, bilateral: Secondary | ICD-10-CM | POA: Insufficient documentation

## 2023-06-22 DIAGNOSIS — F419 Anxiety disorder, unspecified: Secondary | ICD-10-CM | POA: Insufficient documentation

## 2023-06-22 NOTE — Telephone Encounter (Signed)
Audiology referral sent via [facility] to [facility]. Notified patient. Gave pt telephone # [phone]

## 2023-06-27 DIAGNOSIS — D0462 Carcinoma in situ of skin of left upper limb, including shoulder: Secondary | ICD-10-CM | POA: Diagnosis not present

## 2023-07-12 ENCOUNTER — Other Ambulatory Visit: Payer: Self-pay | Admitting: Nurse Practitioner

## 2023-07-12 DIAGNOSIS — R7303 Prediabetes: Secondary | ICD-10-CM | POA: Diagnosis not present

## 2023-07-12 DIAGNOSIS — E559 Vitamin D deficiency, unspecified: Secondary | ICD-10-CM | POA: Diagnosis not present

## 2023-07-12 DIAGNOSIS — E782 Mixed hyperlipidemia: Secondary | ICD-10-CM | POA: Diagnosis not present

## 2023-07-12 DIAGNOSIS — E538 Deficiency of other specified B group vitamins: Secondary | ICD-10-CM | POA: Diagnosis not present

## 2023-07-13 LAB — COMPREHENSIVE METABOLIC PANEL WITH GFR
ALT: 20 IU/L (ref 0–32)
AST: 18 IU/L (ref 0–40)
Albumin: 4.3 g/dL (ref 3.8–4.8)
Alkaline Phosphatase: 105 IU/L (ref 44–121)
BUN/Creatinine Ratio: 16 (ref 12–28)
BUN: 9 mg/dL (ref 8–27)
Bilirubin Total: 0.5 mg/dL (ref 0.0–1.2)
CO2: 26 mmol/L (ref 20–29)
Calcium: 9.1 mg/dL (ref 8.7–10.3)
Chloride: 97 mmol/L (ref 96–106)
Creatinine, Ser: 0.56 mg/dL — ABNORMAL LOW (ref 0.57–1.00)
Globulin, Total: 2.3 g/dL (ref 1.5–4.5)
Glucose: 142 mg/dL — ABNORMAL HIGH (ref 70–99)
Potassium: 4.6 mmol/L (ref 3.5–5.2)
Sodium: 136 mmol/L (ref 134–144)
Total Protein: 6.6 g/dL (ref 6.0–8.5)
eGFR: 95 mL/min/{1.73_m2} (ref >59)

## 2023-07-13 LAB — CBC WITH DIFFERENTIAL/PLATELET
Basophils Absolute: 0.1 10*3/uL (ref 0.0–0.2)
Basos: 1 %
EOS (ABSOLUTE): 0.1 10*3/uL (ref 0.0–0.4)
Eos: 1 %
Hematocrit: 41.7 % (ref 34.0–46.6)
Hemoglobin: 14 g/dL (ref 11.1–15.9)
Immature Grans (Abs): 0 10*3/uL (ref 0.0–0.1)
Immature Granulocytes: 0 %
Lymphocytes Absolute: 1.9 10*3/uL (ref 0.7–3.1)
Lymphs: 32 %
MCH: 32.4 pg (ref 26.6–33.0)
MCHC: 33.6 g/dL (ref 31.5–35.7)
MCV: 97 fL (ref 79–97)
Monocytes Absolute: 0.5 10*3/uL (ref 0.1–0.9)
Monocytes: 9 %
Neutrophils Absolute: 3.3 10*3/uL (ref 1.4–7.0)
Neutrophils: 57 %
Platelets: 235 10*3/uL (ref 150–450)
RBC: 4.32 x10E6/uL (ref 3.77–5.28)
RDW: 12 % (ref 11.7–15.4)
WBC: 5.9 10*3/uL (ref 3.4–10.8)

## 2023-07-13 LAB — HGB A1C W/O EAG: Hgb A1c MFr Bld: 6.4 % — ABNORMAL HIGH (ref 4.8–5.6)

## 2023-07-13 LAB — VITAMIN D 25 HYDROXY (VIT D DEFICIENCY, FRACTURES): Vit D, 25-Hydroxy: 26 ng/mL — ABNORMAL LOW (ref 30.0–100.0)

## 2023-07-13 LAB — LIPID PANEL
Chol/HDL Ratio: 3.5 ratio (ref 0.0–4.4)
Cholesterol, Total: 246 mg/dL — ABNORMAL HIGH (ref 100–199)
HDL: 71 mg/dL (ref >39)
LDL Chol Calc (NIH): 162 mg/dL — ABNORMAL HIGH (ref 0–99)
Triglycerides: 77 mg/dL (ref 0–149)
VLDL Cholesterol Cal: 13 mg/dL (ref 5–40)

## 2023-07-13 LAB — B12 AND FOLATE PANEL
Folate: >20 ng/mL (ref >3.0)
Vitamin B-12: 443 pg/mL (ref 232–1245)

## 2023-07-15 ENCOUNTER — Encounter (HOSPITAL_COMMUNITY): Payer: Self-pay

## 2023-07-21 DIAGNOSIS — H90A22 Sensorineural hearing loss, unilateral, left ear, with restricted hearing on the contralateral side: Secondary | ICD-10-CM | POA: Diagnosis not present

## 2023-07-21 DIAGNOSIS — H903 Sensorineural hearing loss, bilateral: Secondary | ICD-10-CM | POA: Diagnosis not present

## 2023-07-21 DIAGNOSIS — H6122 Impacted cerumen, left ear: Secondary | ICD-10-CM | POA: Diagnosis not present

## 2023-07-22 ENCOUNTER — Encounter

## 2023-07-26 ENCOUNTER — Emergency Department

## 2023-07-26 ENCOUNTER — Observation Stay

## 2023-07-26 ENCOUNTER — Other Ambulatory Visit: Payer: Self-pay

## 2023-07-26 ENCOUNTER — Observation Stay
Admission: EM | Admit: 2023-07-26 | Discharge: 2023-07-27 | Disposition: A | Discharge status: Home / Self Care | Attending: Obstetrics and Gynecology | Admitting: Obstetrics and Gynecology

## 2023-07-26 ENCOUNTER — Telehealth: Payer: Self-pay | Admitting: Nurse Practitioner

## 2023-07-26 DIAGNOSIS — I10 Essential (primary) hypertension: Secondary | ICD-10-CM | POA: Diagnosis present

## 2023-07-26 DIAGNOSIS — F32A Depression, unspecified: Secondary | ICD-10-CM | POA: Diagnosis not present

## 2023-07-26 DIAGNOSIS — I63239 Cerebral infarction due to unspecified occlusion or stenosis of unspecified carotid arteries: Secondary | ICD-10-CM | POA: Diagnosis not present

## 2023-07-26 DIAGNOSIS — R4781 Slurred speech: Secondary | ICD-10-CM | POA: Diagnosis not present

## 2023-07-26 DIAGNOSIS — E119 Type 2 diabetes mellitus without complications: Secondary | ICD-10-CM | POA: Diagnosis not present

## 2023-07-26 DIAGNOSIS — E785 Hyperlipidemia, unspecified: Secondary | ICD-10-CM | POA: Insufficient documentation

## 2023-07-26 DIAGNOSIS — R200 Anesthesia of skin: Secondary | ICD-10-CM | POA: Diagnosis not present

## 2023-07-26 DIAGNOSIS — I6389 Other cerebral infarction: Secondary | ICD-10-CM | POA: Diagnosis not present

## 2023-07-26 DIAGNOSIS — Z7982 Long term (current) use of aspirin: Secondary | ICD-10-CM | POA: Diagnosis not present

## 2023-07-26 DIAGNOSIS — I639 Cerebral infarction, unspecified: Secondary | ICD-10-CM

## 2023-07-26 DIAGNOSIS — I6622 Occlusion and stenosis of left posterior cerebral artery: Secondary | ICD-10-CM | POA: Diagnosis not present

## 2023-07-26 DIAGNOSIS — I6782 Cerebral ischemia: Secondary | ICD-10-CM | POA: Diagnosis not present

## 2023-07-26 DIAGNOSIS — I6381 Other cerebral infarction due to occlusion or stenosis of small artery: Secondary | ICD-10-CM | POA: Diagnosis not present

## 2023-07-26 DIAGNOSIS — Z87891 Personal history of nicotine dependence: Secondary | ICD-10-CM | POA: Diagnosis not present

## 2023-07-26 DIAGNOSIS — Z8673 Personal history of transient ischemic attack (TIA), and cerebral infarction without residual deficits: Secondary | ICD-10-CM | POA: Diagnosis not present

## 2023-07-26 DIAGNOSIS — R202 Paresthesia of skin: Secondary | ICD-10-CM | POA: Diagnosis not present

## 2023-07-26 DIAGNOSIS — Z7902 Long term (current) use of antithrombotics/antiplatelets: Secondary | ICD-10-CM | POA: Insufficient documentation

## 2023-07-26 DIAGNOSIS — Z79899 Other long term (current) drug therapy: Secondary | ICD-10-CM | POA: Diagnosis not present

## 2023-07-26 DIAGNOSIS — R531 Weakness: Secondary | ICD-10-CM | POA: Diagnosis not present

## 2023-07-26 LAB — LIPID PANEL
Cholesterol: 252 mg/dL — ABNORMAL HIGH (ref 0–200)
HDL: 74 mg/dL (ref >40)
LDL Cholesterol: 156 mg/dL — ABNORMAL HIGH (ref 0–99)
Total CHOL/HDL Ratio: 3.4 ratio
Triglycerides: 109 mg/dL (ref <150)
VLDL: 22 mg/dL (ref 0–40)

## 2023-07-26 LAB — DIFFERENTIAL
Abs Immature Granulocytes: 0.02 10*3/uL (ref 0.00–0.07)
Basophils Absolute: 0 10*3/uL (ref 0.0–0.1)
Basophils Relative: 1 %
Eosinophils Absolute: 0.1 10*3/uL (ref 0.0–0.5)
Eosinophils Relative: 1 %
Immature Granulocytes: 0 %
Lymphocytes Relative: 32 %
Lymphs Abs: 2 10*3/uL (ref 0.7–4.0)
Monocytes Absolute: 0.5 10*3/uL (ref 0.1–1.0)
Monocytes Relative: 9 %
Neutro Abs: 3.5 10*3/uL (ref 1.7–7.7)
Neutrophils Relative %: 57 %

## 2023-07-26 LAB — PROTIME-INR
INR: 1 (ref 0.8–1.2)
Prothrombin Time: 13.2 s (ref 11.4–15.2)

## 2023-07-26 LAB — CBC
HCT: 42.9 % (ref 36.0–46.0)
Hemoglobin: 13.9 g/dL (ref 12.0–15.0)
MCH: 31.4 pg (ref 26.0–34.0)
MCHC: 32.4 g/dL (ref 30.0–36.0)
MCV: 96.8 fL (ref 80.0–100.0)
Platelets: 246 10*3/uL (ref 150–400)
RBC: 4.43 MIL/uL (ref 3.87–5.11)
RDW: 12.8 % (ref 11.5–15.5)
WBC: 6.1 10*3/uL (ref 4.0–10.5)
nRBC: 0 % (ref 0.0–0.2)

## 2023-07-26 LAB — COMPREHENSIVE METABOLIC PANEL WITH GFR
ALT: 28 U/L (ref 0–44)
AST: 26 U/L (ref 15–41)
Albumin: 4 g/dL (ref 3.5–5.0)
Alkaline Phosphatase: 74 U/L (ref 38–126)
Anion gap: 8 (ref 5–15)
BUN: 9 mg/dL (ref 8–23)
CO2: 28 mmol/L (ref 22–32)
Calcium: 9 mg/dL (ref 8.9–10.3)
Chloride: 99 mmol/L (ref 98–111)
Creatinine, Ser: 0.58 mg/dL (ref 0.44–1.00)
GFR, Estimated: >60 mL/min (ref >60)
Glucose, Bld: 156 mg/dL — ABNORMAL HIGH (ref 70–99)
Potassium: 3.7 mmol/L (ref 3.5–5.1)
Sodium: 135 mmol/L (ref 135–145)
Total Bilirubin: 0.7 mg/dL (ref 0.0–1.2)
Total Protein: 7.4 g/dL (ref 6.5–8.1)

## 2023-07-26 LAB — APTT: aPTT: 29 s (ref 24–36)

## 2023-07-26 LAB — ETHANOL: Alcohol, Ethyl (B): <15 mg/dL (ref <15)

## 2023-07-26 LAB — CBG MONITORING, ED: Glucose-Capillary: 156 mg/dL — ABNORMAL HIGH (ref 70–99)

## 2023-07-26 LAB — TSH: TSH: 0.681 u[IU]/mL (ref 0.350–4.500)

## 2023-07-26 MED ORDER — ACETAMINOPHEN 325 MG PO TABS: 650.0000 mg | ORAL_TABLET | ORAL | Status: DC | PRN

## 2023-07-26 MED ORDER — ALPRAZOLAM 0.25 MG PO TABS: 0.2500 mg | ORAL_TABLET | Freq: Two times a day (BID) | ORAL | Status: DC | PRN

## 2023-07-26 MED ORDER — SODIUM CHLORIDE 0.9 % IV SOLN: INTRAVENOUS | Status: AC

## 2023-07-26 MED ORDER — GADOBUTROL 1 MMOL/ML IV SOLN
7.0000 mL | Freq: Once | INTRAVENOUS | Status: AC | PRN
  Administered 2023-07-26: 7 mL via INTRAVENOUS

## 2023-07-26 MED ORDER — ACETAMINOPHEN 160 MG/5ML PO SOLN: 650.0000 mg | ORAL | Status: DC | PRN

## 2023-07-26 MED ORDER — CLOPIDOGREL BISULFATE 75 MG PO TABS
75.0000 mg | ORAL_TABLET | Freq: Every day | ORAL | Status: DC
Start: 2023-07-26 — End: 2023-07-27
  Administered 2023-07-26 – 2023-07-27 (×2): 75 mg via ORAL
  Filled 2023-07-26 (×2): qty 1

## 2023-07-26 MED ORDER — ASPIRIN 81 MG PO TBEC
81.0000 mg | DELAYED_RELEASE_TABLET | Freq: Every day | ORAL | Status: DC
  Administered 2023-07-26 – 2023-07-27 (×2): 81 mg via ORAL
  Filled 2023-07-26 (×2): qty 1

## 2023-07-26 MED ORDER — ACETAMINOPHEN 650 MG RE SUPP: 650.0000 mg | RECTAL | Status: DC | PRN

## 2023-07-26 MED ORDER — SENNOSIDES-DOCUSATE SODIUM 8.6-50 MG PO TABS: 1.0000 | ORAL_TABLET | Freq: Every evening | ORAL | Status: DC | PRN

## 2023-07-26 MED ORDER — VENLAFAXINE HCL ER 75 MG PO CP24
225.0000 mg | ORAL_CAPSULE | Freq: Every day | ORAL | Status: DC
  Administered 2023-07-27: 225 mg via ORAL
  Filled 2023-07-26: qty 3

## 2023-07-26 MED ORDER — ATORVASTATIN CALCIUM 20 MG PO TABS
80.0000 mg | ORAL_TABLET | Freq: Every day | ORAL | Status: DC
  Administered 2023-07-26 – 2023-07-27 (×2): 80 mg via ORAL
  Filled 2023-07-26 (×2): qty 4

## 2023-07-26 MED ORDER — STROKE: EARLY STAGES OF RECOVERY BOOK: Freq: Once | Status: AC

## 2023-07-26 MED ORDER — ENOXAPARIN SODIUM 40 MG/0.4ML IJ SOSY
40.0000 mg | PREFILLED_SYRINGE | INTRAMUSCULAR | Status: DC
  Administered 2023-07-26: 40 mg via SUBCUTANEOUS
  Filled 2023-07-26: qty 0.4

## 2023-07-26 NOTE — ED Triage Notes (Signed)
 Pt arrived via [facility] from home with c/o stroke like symptoms with c/o lips tingling and feeling swimmy headed. Pt states LKW was 0730 and pt states that they woke up around 0700 this morning. Pt denies HA, weakness, SOB, CP. Per EMS, family states that pt's speech sounds a little slurred to them.

## 2023-07-26 NOTE — H&P (Signed)
 History and Physical    Patient: Bonnie Washington UEA:[id number] DOB: 1945-08-06 DOA: 07/26/2023 DOS: the patient was seen and examined on 07/26/2023 PCP: Laurence Pons, NP  Patient coming from: Home  Chief Complaint:  Chief Complaint  Patient presents with   stroke like symptoms   HPI: Bonnie Washington is a 78 y.o. female with medical history significant of HTN, T2DM, depression,  Presents to the ED for evaluation of perioral numbness and mild dysarthria she first noticed when she woke up this morning.  Admits to some blurred vision as well. No dizziness/imbalance, focal weakness, or changes in sensation peripherally.  CT head revealed age-indeterminate right sided lacunar infarct. Otherwise workup benign. BP 150-160's/60s. On my exam NIH 3-4.   Will admit for completion of CVA workup.     Review of Systems: Review of Systems  Constitutional:  Negative for chills, fever, malaise/fatigue and weight loss.  Eyes:  Positive for blurred vision. Negative for double vision.  Cardiovascular:  Negative for chest pain, palpitations and leg swelling.  Gastrointestinal:  Negative for abdominal pain, diarrhea, heartburn, nausea and vomiting.  Genitourinary:  Negative for dysuria and frequency.  Musculoskeletal:  Negative for back pain, falls, myalgias and neck pain.  Skin:  Negative for rash.  Neurological:  Positive for tingling (perioral), sensory change and speech change. Negative for dizziness, focal weakness, seizures, loss of consciousness, weakness and headaches.  Psychiatric/Behavioral:  Positive for depression. Negative for suicidal ideas.     Past Medical History:  Diagnosis Date   Depression    Diabetes mellitus without complication (HCC)    pt use to take metformin but has not taken it in 15yrs/lost weight/ stopped metformin/ watches diet   Hypertension    controlled on meds   Past Surgical History:  Procedure Laterality Date   CHOLECYSTECTOMY  2014   FRACTURE SURGERY Right 2004    elbow   KNEE ARTHROSCOPY WITH MEDIAL MENISECTOMY Left 09/21/2018   Procedure: KNEE ARTHROSCOPY WITH MEDIAL AND LATERAL MENISECTOMY, SYNOVECTOMY, CHONDROPLASTY;  Surgeon: Jerlyn Moons, MD;  Location: [facility];  Service: Orthopedics;  Laterality: Left;  Diabetes-diet controlled   OPEN REDUCTION INTERNAL FIXATION (ORIF) DISTAL RADIAL FRACTURE Left 10/06/2015   Procedure: OPEN REDUCTION INTERNAL FIXATION (ORIF) DISTAL RADIAL FRACTURE;  Surgeon: Marlynn Singer, MD;  Location: [facility];  Service: Orthopedics;  Laterality: Left;   VENTRAL HERNIA REPAIR N/A 06/10/2017   Procedure: HERNIA REPAIR VENTRAL ADULT;  Surgeon: Marshall Skeeter, MD;  Location: [facility];  Service: General;  Laterality: N/A;   Social History:  reports that she quit smoking about 30 years ago. Her smoking use included cigarettes. She started smoking about 50 years ago. She has a 10 pack-year smoking history. She has never used smokeless tobacco. She reports current alcohol use of about 7.0 standard drinks of alcohol per week. She reports that she does not use drugs.  Allergies  Allergen Reactions   Codeine Itching    Okay if takes benadryl along with it    Family History  Problem Relation Age of Onset   Hypertension Mother    Breast cancer Cousin     Prior to Admission medications   Medication Sig Start Date End Date Taking? Authorizing Provider  ALPRAZolam  (XANAX ) 0.25 MG tablet Take 1 tablet (0.25 mg total) by mouth 2 (two) times daily as needed for anxiety. 05/11/22   Laurence Pons, NP  aspirin  EC 81 MG tablet Take 1 tablet (81 mg total) by mouth daily. 12/15/20   Laurence Pons,  NP  atenolol  (TENORMIN ) 25 MG tablet Take 1/2 tablet po QPM 06/20/23   Laurence Pons, NP  fluticasone  (FLONASE ) 50 MCG/ACT nasal spray PLACE 1 SPRAY IN EACH NOSTRIL AT BEDTIME 05/11/22   Laurence Pons, NP  venlafaxine  XR (EFFEXOR -XR) 75 MG 24 hr capsule TAKE THREE CAPSULES BY MOUTH ONCE DAILY 06/20/23   Laurence Pons,  NP    Physical Exam: Vitals:   07/26/23 1130 07/26/23 1200 07/26/23 1230 07/26/23 1300  BP: (!) 150/63 (!) 163/63 (!) 180/63 (!) 160/70  Pulse: (!) 57 60 68 64  Resp: 16 (!) 26 15 17   Temp:      TempSrc:      SpO2: 100% 98% 100% 99%  Weight:      Height:       Physical Exam Vitals and nursing note reviewed.  HENT:     Head: Normocephalic and atraumatic.  Eyes:     General: Visual field deficit present.     Pupils: Pupils are equal, round, and reactive to light.  Cardiovascular:     Rate and Rhythm: Normal rate and regular rhythm.  Pulmonary:     Effort: Pulmonary effort is normal. No respiratory distress.     Breath sounds: Normal breath sounds.  Abdominal:     General: There is no distension.     Palpations: Abdomen is soft.     Tenderness: There is no abdominal tenderness.  Musculoskeletal:        General: No tenderness.     Cervical back: Normal range of motion and neck supple.     Right lower leg: No edema.     Left lower leg: No edema.  Skin:    General: Skin is warm and dry.     Capillary Refill: Capillary refill takes less than 2 seconds.     Coloration: Skin is not jaundiced.  Neurological:     Mental Status: She is alert and oriented to person, place, and time.     Cranial Nerves: Dysarthria present. No facial asymmetry.     Sensory: Sensation is intact.     Motor: No weakness, tremor, atrophy, abnormal muscle tone or pronator drift.     Coordination: Coordination is intact. Romberg sign negative. Coordination normal. Finger-Nose-Finger Test normal.  Psychiatric:        Mood and Affect: Mood normal.        Behavior: Behavior normal.     Data Reviewed:   Labs on Admission: I have personally reviewed following labs and imaging studies  CBC: Recent Labs  Lab 07/26/23 1118  WBC 6.1  NEUTROABS 3.5  HGB 13.9  HCT 42.9  MCV 96.8  PLT 246   Basic Metabolic Panel: Recent Labs  Lab 07/26/23 1118  NA 135  K 3.7  CL 99  CO2 28  GLUCOSE 156*   BUN 9  CREATININE 0.58  CALCIUM 9.0   GFR: Estimated Creatinine Clearance: 53.6 mL/min (by C-G formula based on SCr of 0.58 mg/dL). Liver Function Tests: Recent Labs  Lab 07/26/23 1118  AST 26  ALT 28  ALKPHOS 74  BILITOT 0.7  PROT 7.4  ALBUMIN 4.0   No results for input(s): "LIPASE", "AMYLASE" in the last 168 hours. No results for input(s): "AMMONIA" in the last 168 hours. Coagulation Profile: Recent Labs  Lab 07/26/23 1118  INR 1.0   Cardiac Enzymes: No results for input(s): "CKTOTAL", "CKMB", "CKMBINDEX", "TROPONINI" in the last 168 hours. BNP (last 3 results) No results for input(s): "PROBNP" in the last 8760 hours.  HbA1C: No results for input(s): "HGBA1C" in the last 72 hours. CBG: Recent Labs  Lab 07/26/23 1023  GLUCAP 156*   Lipid Profile: Recent Labs    07/26/23 1118  CHOL 252*  HDL 74  LDLCALC 156*  TRIG 109  CHOLHDL 3.4   Thyroid  Function Tests: No results for input(s): "TSH", "T4TOTAL", "FREET4", "T3FREE", "THYROIDAB" in the last 72 hours. Anemia Panel: No results for input(s): "VITAMINB12", "FOLATE", "FERRITIN", "TIBC", "IRON", "RETICCTPCT" in the last 72 hours. Urine analysis:    Component Value Date/Time   APPEARANCEUR Clear 05/11/2022 1548   GLUCOSEU Negative 05/11/2022 1548   BILIRUBINUR Negative 05/11/2022 1548   PROTEINUR Negative 05/11/2022 1548   UROBILINOGEN 0.2 09/14/2018 0952   NITRITE Negative 05/11/2022 1548   LEUKOCYTESUR Negative 05/11/2022 1548    Radiological Exams on Admission: CT HEAD WO CONTRAST Result Date: 07/26/2023 CLINICAL DATA:  Provided history: Numbness and tingling around lips. EXAM: CT HEAD WITHOUT CONTRAST TECHNIQUE: Contiguous axial images were obtained from the base of the skull through the vertex without intravenous contrast. RADIATION DOSE REDUCTION: This exam was performed according to the departmental dose-optimization program which includes automated exposure control, adjustment of the mA and/or kV  according to patient size and/or use of iterative reconstruction technique. COMPARISON:  None. FINDINGS: Brain: Generalized cerebral atrophy. Age-indeterminate lacunar infarct centered within the genu of right internal capsule. Mild patchy and ill-defined hypoattenuation elsewhere within the cerebral white matter, nonspecific but compatible with chronic small vessel ischemic disease. There is no acute intracranial hemorrhage. No demarcated cortical infarct. No extra-axial fluid collection. No evidence of an intracranial mass. No midline shift. Vascular: No hyperdense vessel.  Atherosclerotic calcifications. Skull: No calvarial fracture or aggressive osseous lesion. Sinuses/Orbits: No mass or acute finding within the imaged orbits. Severe left sphenoid sinusitis (with complete sinus opacification, sinus atelectasis and chronic reactive osteitis). IMPRESSION: 1. Age-indeterminate lacunar infarct within the genu of the right internal capsule. Consider a brain MRI for further evaluation. 2. Background mild cerebral white matter chronic small vessel ischemic disease. 3. Generalized cerebral atrophy. 4. Severe left sphenoid sinusitis (with sinus atelectasis). Electronically Signed   By: Bascom Lily D.O.   On: 07/26/2023 11:56       Assessment and Plan: No notes have been filed under this hospital service. Service: Hospitalist  78 y/o F with HTN presenting with slurred speech and perioral numbness present upon awakening this morning. NIH 3-4 on my exam. Found to have age indeterminate right lacunar infarct. Admit for completion of CVA workup  Right lacunar infarct, age indeterminate   - MRI brain, MRA head/neck, ECHO - monitor on telemetry  - Neurochecks - NPO pending SLP evaluation. PT/OT consults appreciated.  - asa and statin for now.  - check UA for UTI as possible trigger for recrudescence. If MRI confirms acute will consult Neurology and consider DAPT  - allow permissive HTN for now. If determined  chronic on MRI will resume home atenolol .   HTN- as above   HLD - Total cholesterol 246 and LDL 162.  - initiated high intensity statin   T2DM -A1c 6.4% earlier this month. Currently diet controlled. May benefit from metformin at discharge. Currently glucose at inpatient goal.  - SSI for now   Depression- home medications resumed.   Lovenox  NS@40cc /h NPO pending SLP   Advance Care Planning:   Code Status: Full Code- per discussion with patient at time of admission   Severity of Illness: The appropriate patient status for this patient is OBSERVATION. Observation  status is judged to be reasonable and necessary in order to provide the required intensity of service to ensure the patient's safety. The patient's presenting symptoms, physical exam findings, and initial radiographic and laboratory data in the context of their medical condition is felt to place them at decreased risk for further clinical deterioration. Furthermore, it is anticipated that the patient will be medically stable for discharge from the hospital within 2 midnights of admission.   Author: Charlesetta Connors, DO 07/26/2023 2:08 PM  For on call review [email or web address].

## 2023-07-26 NOTE — ED Provider Notes (Signed)
 [facility] Provider Note    Event Date/Time   First MD Initiated Contact with Patient 07/26/23 1021     (approximate)   History   stroke like symptoms   HPI  Bonnie Washington is a 78 y.o. female who presents to the emergency department today because of concerns for tingling around her mouth and possible slurred speech.  Patient told myself that she woke up with the symptoms.  She noticed when she woke up that she was having some tingling and numbness around her lips.  Describes it like she had gotten Novocain.  This has gotten somewhat better since she woke up although it is still present at the time my exam.  Family told her that they thought she was slurring her speech although she has not appreciated that.  Patient states she was in her normal state of health yesterday and did not do any unusual activity.     Physical Exam   Triage Vital Signs: ED Triage Vitals  Encounter Vitals Group     BP 07/26/23 1026 (!) 169/68     Systolic BP Percentile --      Diastolic BP Percentile --      Pulse Rate 07/26/23 1026 65     Resp 07/26/23 1026 15     Temp 07/26/23 1026 98 F (36.7 C)     Temp Source 07/26/23 1026 Oral     SpO2 07/26/23 1026 100 %     Weight 07/26/23 1029 139 lb 12.4 oz (63.4 kg)     Height 07/26/23 1029 5\' 3"  (1.6 m)     Head Circumference --      Peak Flow --      Pain Score 07/26/23 1028 0     Pain Loc --      Pain Education --      Exclude from Growth Chart --     Most recent vital signs: Vitals:   07/26/23 1026 07/26/23 1028  BP: (!) 169/68 (!) 169/68  Pulse: 65 63  Resp: 15 18  Temp: 98 F (36.7 C)   SpO2: 100% 100%   General: Awake, alert, oriented. CV:  Good peripheral perfusion. Regular rate and rhythm. Resp:  Normal effort. Lungs clear. Abd:  No distention.  Other:  PERRL. EOMI. Face symmetric, tongue midline. Strength 5/5 in upper and lower extremities. Sensation grossly intact.    ED Results / Procedures /  Treatments   Labs (all labs ordered are listed, but only abnormal results are displayed) Labs Reviewed  CBG MONITORING, ED - Abnormal; Notable for the following components:      Result Value   Glucose-Capillary 156 (*)    All other components within normal limits  PROTIME-INR  APTT  CBC  DIFFERENTIAL  COMPREHENSIVE METABOLIC PANEL WITH GFR  ETHANOL  CBG MONITORING, ED     EKG  I, Marylynn Soho, attending physician, personally viewed and interpreted this EKG  EKG Time: 1222 Rate: 67 Rhythm: sinus rhythm Axis: normal Intervals: qtc 453 QRS: narrow ST changes: no st elevation Impression: normal ekg   RADIOLOGY I independently interpreted and visualized the CT head. My interpretation: No ICH Radiology interpretation:  IMPRESSION:  1. Age-indeterminate lacunar infarct within the genu of the right  internal capsule. Consider a brain MRI for further evaluation.  2. Background mild cerebral white matter chronic small vessel  ischemic disease.  3. Generalized cerebral atrophy.  4. Severe left sphenoid sinusitis (with sinus atelectasis).  PROCEDURES:  Critical Care performed: No    MEDICATIONS ORDERED IN ED: Medications - No data to display   IMPRESSION / MDM / ASSESSMENT AND PLAN / ED COURSE  I reviewed the triage vital signs and the nursing notes.                              Differential diagnosis includes, but is not limited to, CVA, TIA, electrolyte abnormality, anemia  Patient's presentation is most consistent with acute presentation with potential threat to life or bodily function.   The patient is on the cardiac monitor to evaluate for evidence of arrhythmia and/or significant heart rate changes.  Patient presented to the emergency department today because of concerns for numbness around her mouth and potentially slurred speech.  Patient did state that the symptoms were present upon awakening this morning.  That she would be outside the window  for TNK.  CT head was obtained which was concerning for age-indeterminate infarct.  Blood work without concerning abnormality.  Given abnormal head CT did discuss with patient and recommended admission for stroke workup.  Discussed with Dr. Scherrie Curt with the hospitalist service who will evaluate for admission.    FINAL CLINICAL IMPRESSION(S) / ED DIAGNOSES   Final diagnoses:  Cerebrovascular accident (CVA), unspecified mechanism (HCC)        Rx / DC Orders    Note:  This document was prepared using Dragon voice recognition software and may include unintentional dictation errors.    Marylynn Soho, MD 07/26/23 [phone]

## 2023-07-26 NOTE — Plan of Care (Signed)
  Problem: Education: Goal: Knowledge of disease or condition will improve Outcome: Progressing   Problem: Ischemic Stroke/TIA Tissue Perfusion: Goal: Complications of ischemic stroke/TIA will be minimized Outcome: Progressing   Problem: Coping: Goal: Will verbalize positive feelings about self Outcome: Progressing Goal: Will identify appropriate support needs Outcome: Progressing   Problem: Health Behavior/Discharge Planning: Goal: Ability to manage health-related needs will improve Outcome: Progressing   Problem: Self-Care: Goal: Ability to participate in self-care as condition permits will improve Outcome: Progressing   Problem: Nutrition: Goal: Risk of aspiration will decrease Outcome: Progressing Goal: Dietary intake will improve Outcome: Progressing   Problem: Clinical Measurements: Goal: Ability to maintain clinical measurements within normal limits will improve Outcome: Progressing Goal: Will remain free from infection Outcome: Progressing Goal: Diagnostic test results will improve Outcome: Progressing Goal: Respiratory complications will improve Outcome: Progressing Goal: Cardiovascular complication will be avoided Outcome: Progressing   Problem: Activity: Goal: Risk for activity intolerance will decrease Outcome: Progressing   Problem: Nutrition: Goal: Adequate nutrition will be maintained Outcome: Progressing   Problem: Elimination: Goal: Will not experience complications related to bowel motility Outcome: Progressing Goal: Will not experience complications related to urinary retention Outcome: Progressing   Problem: Pain Managment: Goal: General experience of comfort will improve and/or be controlled Outcome: Progressing   Problem: Safety: Goal: Ability to remain free from injury will improve Outcome: Progressing   Problem: Skin Integrity: Goal: Risk for impaired skin integrity will decrease Outcome: Progressing

## 2023-07-26 NOTE — Telephone Encounter (Signed)
 Audiology appointment 07/21/2023 @ [facility]

## 2023-07-26 NOTE — Evaluation (Signed)
 Physical Therapy Evaluation Patient Details Name: Bonnie Washington MRN: [id number] DOB: 02-07-1946 Today's Date: 07/26/2023  History of Present Illness  Pt admitted for possible CVA with pending imaging. History includes HTN, DM and depression.  Clinical Impression  Pt is a pleasant 78 year old female who was admitted for possible CVA. Pt demonstrates all bed mobility/transfers/ambulation at baseline level. Coordination/strength/sensation testing WNL. No problem with speech noted during evaluation. Pt does not require any further PT needs at this time. Pt will be dc in house and does not require follow up. RN aware. Will dc current orders.         If plan is discharge home, recommend the following:     Can travel by private vehicle        Equipment Recommendations None recommended by PT  Recommendations for Other Services       Functional Status Assessment Patient has not had a recent decline in their functional status     Precautions / Restrictions Precautions Precautions: Fall Recall of Precautions/Restrictions: Intact Restrictions Weight Bearing Restrictions Per Provider Order: No      Mobility  Bed Mobility Overal bed mobility: Independent             General bed mobility comments: safe technique    Transfers Overall transfer level: Independent Equipment used: None               General transfer comment: safe technique    Ambulation/Gait Ambulation/Gait assistance: Independent Gait Distance (Feet): 100 Feet Assistive device: None Gait Pattern/deviations: WFL(Within Functional Limits)       General Gait Details: safe technique. Able to carry conversation with exertion  Stairs            Wheelchair Mobility     Tilt Bed    Modified Rankin (Stroke Patients Only)       Balance Overall balance assessment: Independent                                           Pertinent Vitals/Pain Pain Assessment Pain Assessment:  No/denies pain    Home Living Family/patient expects to be discharged to:: Private residence Living Arrangements: Alone Available Help at Discharge: Family Type of Home: Apartment Home Access: Level entry       Home Layout: One level Home Equipment: None      Prior Function Prior Level of Function : Independent/Modified Independent;Driving             Mobility Comments: indep, 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 no falls ADLs Comments: indep     Extremity/Trunk Assessment   Upper Extremity Assessment Upper Extremity Assessment: Overall WFL for tasks assessed    Lower Extremity Assessment Lower Extremity Assessment: Overall WFL for tasks assessed       Communication   Communication Communication: No apparent difficulties    Cognition Arousal: Alert Behavior During Therapy: WFL for tasks assessed/performed   PT - Cognitive impairments: No apparent impairments                       PT - Cognition Comments: pleasant and agreeable to session Following commands: Intact       Cueing Cueing Techniques: Verbal cues     General Comments      Exercises     Assessment/Plan    PT Assessment Patient does not need  any further PT services  PT Problem List         PT Treatment Interventions      PT Goals (Current goals can be found in the Care Plan section)  Acute Rehab PT Goals Patient Stated Goal: to go home PT Goal Formulation: All assessment and education complete, DC therapy Time For Goal Achievement: 07/26/23 Potential to Achieve Goals: Good    Frequency       Co-evaluation               AM-PAC PT "6 Clicks" Mobility  Outcome Measure Help needed turning from your back to your side while in a flat bed without using bedrails?: None Help needed moving from lying on your back to sitting on the side of a flat bed without using bedrails?: None Help needed moving to and from a bed to a chair (including a wheelchair)?:  None Help needed standing up from a chair using your arms (e.g., wheelchair or bedside chair)?: None Help needed to walk in hospital room?: None Help needed climbing 3-5 steps with a railing? : None 6 Click Score: 24    End of Session   Activity Tolerance: Patient tolerated treatment well Patient left: in bed Nurse Communication: Mobility status PT Visit Diagnosis: Difficulty in walking, not elsewhere classified (R26.2)    Time: 1308-6578 PT Time Calculation (min) (ACUTE ONLY): 8 min   Charges:   PT Evaluation $PT Eval Low Complexity: 1 Low   PT General Charges $$ ACUTE PT VISIT: 1 Visit         Bonnie Washington, PT, DPT, GCS [phone]   Bonnie Washington 07/26/2023, 4:32 PM

## 2023-07-27 ENCOUNTER — Other Ambulatory Visit: Payer: Self-pay

## 2023-07-27 ENCOUNTER — Observation Stay (HOSPITAL_BASED_OUTPATIENT_CLINIC_OR_DEPARTMENT_OTHER)
Admit: 2023-07-27 | Discharge: 2023-07-27 | Disposition: A | Discharge status: Home / Self Care | Attending: Emergency Medicine | Admitting: Emergency Medicine

## 2023-07-27 DIAGNOSIS — I639 Cerebral infarction, unspecified: Secondary | ICD-10-CM

## 2023-07-27 DIAGNOSIS — I6381 Other cerebral infarction due to occlusion or stenosis of small artery: Secondary | ICD-10-CM | POA: Diagnosis not present

## 2023-07-27 DIAGNOSIS — I6389 Other cerebral infarction: Secondary | ICD-10-CM | POA: Diagnosis not present

## 2023-07-27 DIAGNOSIS — I10 Essential (primary) hypertension: Secondary | ICD-10-CM

## 2023-07-27 LAB — ECHOCARDIOGRAM COMPLETE
Area-P 1/2: 3.03 cm2
Height: 63 in
S' Lateral: 2.8 cm
Weight: 2236.35 [oz_av]

## 2023-07-27 MED ORDER — ASPIRIN 81 MG PO TBEC: 81.0000 mg | DELAYED_RELEASE_TABLET | Freq: Every day | ORAL | 0 refills | Status: DC

## 2023-07-27 MED ORDER — ASPIRIN 81 MG PO TBEC
81.0000 mg | DELAYED_RELEASE_TABLET | Freq: Every day | ORAL | 0 refills | Status: AC
  Filled 2023-07-27: qty 90, 90d supply, fill #0

## 2023-07-27 MED ORDER — ATORVASTATIN CALCIUM 80 MG PO TABS: 80.0000 mg | ORAL_TABLET | Freq: Every day | ORAL | 1 refills | Status: DC

## 2023-07-27 MED ORDER — CLOPIDOGREL BISULFATE 75 MG PO TABS: 75.0000 mg | ORAL_TABLET | Freq: Every day | ORAL | 1 refills | Status: DC

## 2023-07-27 NOTE — Progress Notes (Signed)
 SLP Cancellation Note  Patient Details Name: Bonnie Washington MRN: [id number] DOB: [birth year]   Cancelled treatment:       Reason Eval/Treat Not Completed: SLP screened, no needs identified, will sign off (chart reviewed; consulted NSG and met w/ pt in room.)  Pt w/ medical history significant of HTN, T2DM, depression,  who presents to the ED for evaluation of stroke. See MRI results.  Pt denied any difficulty swallowing and is currently on a regular diet; tolerates swallowing pills w/ water per NSG.  Pt conversed in Full conversation w/out expressive/receptive deficits noted; pt denied any speech-language deficits. Speech clear, 100% intelligible w/ pt c/o "numb tongue tip"(slight impact on frontal speech sounds intermittently). No further Acute skilled ST services indicated as pt's speech is 100% intelligible. Recommend f/u post D/C home w/ Outpatient Speech services to address any needs. Pt agreed. NSG to reconsult if any change in status while admitted.     Darla Edward, MS, CCC-SLP Speech Language Pathologist Rehab Services; [facility] [phone] (ascom) Bonnie Washington 07/27/2023, 11:15 AM

## 2023-07-27 NOTE — Discharge Summary (Addendum)
 Physician Discharge Summary   Patient: Bonnie Washington MRN: [id number] DOB: 1945-10-10  Admit date:     07/26/2023  Discharge date: 07/27/23  Discharge Physician: Bonnie Washington   PCP: Bonnie Washington   Recommendations at discharge:  Please obtain CBC and BMP and follow-up Follow-up with outpatient neurology-referral was provided Follow-up with primary care provider  Discharge Diagnoses: Principal Problem:   Right-sided lacunar infarction [facility]) Active Problems:   Essential hypertension   Cerebrovascular accident (CVA) [facility])   Hospital Course:  Bonnie Washington is a 78 y.o. female with medical history significant of HTN, T2DM, depression,  Presents to the ED for evaluation of perioral numbness and mild dysarthria she first noticed when she woke up this morning.  Admits to some blurred vision as well. No dizziness/imbalance, focal weakness, or changes in sensation peripherally.   On presentation mildly elevated blood pressure otherwise stable vital and labs.CT head revealed age-indeterminate right sided lacunar infarct.   Admitted for stroke workup.  5/21: Blood pressure mildly elevated at 156/73.  MRI with 4 mm acute infarct at the left pontomesencephalic junction.  MRA head and neck was negative for any LVO, did show severe stenosis within the left posterior cerebral artery. Lipid panel with elevated total cholesterol at 252 and LDL of 156-started on high intensity statin.  Recent A1c of 6.4. Started on DAPT with aspirin  and Plavix along with Lipitor 80 mg daily. Echocardiogram normal and PT and OT without any follow-up recommendations.  Patient will continue on DAPT with aspirin  and Plavix for 90 days and then stop taking aspirin  and continue with Plavix. She need to follow-up with outpatient neurology for further assistance.  Patient will continue on current medications and follow-up with her providers for further management.   Consultants: Neurology Procedures performed:  None Disposition: Home Diet recommendation:  Discharge Diet Orders (From admission, onward)     Start     Ordered   07/27/23 0000  Diet - low sodium heart healthy        07/27/23 1254           Cardiac diet DISCHARGE MEDICATION: Allergies as of 07/27/2023       Reactions   Codeine Itching   Okay if takes benadryl along with it        Medication List     TAKE these medications    ALPRAZolam  0.25 MG tablet Commonly known as: XANAX  Take 1 tablet (0.25 mg total) by mouth 2 (two) times daily as needed for anxiety.   aspirin  EC 81 MG tablet Take 1 tablet (81 mg total) by mouth daily. Swallow whole. Start taking on: Jul 28, 2023 What changed: additional instructions   atenolol  25 MG tablet Commonly known as: TENORMIN  Take 1/2 tablet po QPM What changed:  how much to take how to take this when to take this   atorvastatin 80 MG tablet Commonly known as: LIPITOR Take 1 tablet (80 mg total) by mouth daily. Start taking on: Jul 28, 2023   clopidogrel 75 MG tablet Commonly known as: PLAVIX Take 1 tablet (75 mg total) by mouth daily. Start taking on: Jul 28, 2023   fluticasone  50 MCG/ACT nasal spray Commonly known as: FLONASE  PLACE 1 SPRAY IN EACH NOSTRIL AT BEDTIME   multivitamin with minerals Tabs tablet Take 1 tablet by mouth daily.   venlafaxine  XR 75 MG 24 hr capsule Commonly known as: EFFEXOR -XR TAKE THREE CAPSULES BY MOUTH ONCE DAILY        Follow-up Information  Bonnie Washington Follow up.   Specialty: Nurse Practitioner Why: Hospital follow up Contact information: [address] [place] [address] [phone]                Discharge Exam: Bonnie Washington Weights   07/26/23 1029  Weight: 63.4 kg   General.  Well-developed elderly lady, in no acute distress. Pulmonary.  Lungs clear bilaterally, normal respiratory effort. CV.  Regular rate and rhythm, no JVD, rub or murmur. Abdomen.  Soft, nontender, nondistended, BS  positive. CNS.  Alert and oriented .  No focal neurologic deficit. Extremities.  No edema, no cyanosis, pulses intact and symmetrical. Psychiatry.  Judgment and insight appears normal.   Condition at discharge: stable  The results of significant diagnostics from this hospitalization (including imaging, microbiology, ancillary and laboratory) are listed below for reference.   Imaging Studies: ECHOCARDIOGRAM COMPLETE Result Date: 07/27/2023    ECHOCARDIOGRAM REPORT   Patient Name:   Bonnie Washington Date of Exam: 07/27/2023 Medical Rec #:  [id number]    Height:       63.0 in Accession #:    [id number]   Weight:       139.8 lb Date of Birth:  05-Apr-1945    BSA:          1.661 m Patient Age:    78 years     BP:           141/62 mmHg Patient Gender: F            HR:           63 bpm. Exam Location:  [facility] Procedure: 2D Echo (Both Spectral and Color Flow Doppler were utilized during            procedure). Indications:     stroke  History:         Patient has no prior history of Echocardiogram examinations.                  Risk Factors:Hypertension and Diabetes.  Sonographer:     Bonnie Washington RDCS Referring Phys:  [id number] Bonnie Washington [facility] Diagnosing Phys: Bonnie Boyden MD IMPRESSIONS  1. Left ventricular ejection fraction, by estimation, is 60 to 65%. The left ventricle has normal function. The left ventricle has no regional wall motion abnormalities. Left ventricular diastolic parameters were normal.  2. Right ventricular systolic function is normal. The right ventricular size is normal. There is normal pulmonary artery systolic pressure.  3. The mitral valve is normal in structure. No evidence of mitral valve regurgitation. No evidence of mitral stenosis.  4. The aortic valve is normal in structure. Aortic valve regurgitation is not visualized. Aortic valve sclerosis is present, with no evidence of aortic valve stenosis.  5. The inferior vena cava is normal in size with greater than 50% respiratory variability,  suggesting right atrial pressure of 3 mmHg. FINDINGS  Left Ventricle: Left ventricular ejection fraction, by estimation, is 60 to 65%. The left ventricle has normal function. The left ventricle has no regional wall motion abnormalities. Strain was performed and the global longitudinal strain is indeterminate. The left ventricular internal cavity size was normal in size. There is no left ventricular hypertrophy. Left ventricular diastolic parameters were normal. Right Ventricle: The right ventricular size is normal. No increase in right ventricular wall thickness. Right ventricular systolic function is normal. There is normal pulmonary artery systolic pressure. The tricuspid regurgitant velocity is 2.37 m/s, and  with an assumed right atrial pressure of 3  mmHg, the estimated right ventricular systolic pressure is 25.5 mmHg. Left Atrium: Left atrial size was normal in size. Right Atrium: Right atrial size was normal in size. Pericardium: There is no evidence of pericardial effusion. Mitral Valve: The mitral valve is normal in structure. No evidence of mitral valve regurgitation. No evidence of mitral valve stenosis. Tricuspid Valve: The tricuspid valve is normal in structure. Tricuspid valve regurgitation is mild . No evidence of tricuspid stenosis. Aortic Valve: The aortic valve is normal in structure. Aortic valve regurgitation is not visualized. Aortic valve sclerosis is present, with no evidence of aortic valve stenosis. Pulmonic Valve: The pulmonic valve was normal in structure. Pulmonic valve regurgitation is not visualized. No evidence of pulmonic stenosis. Aorta: The aortic root is normal in size and structure. Venous: The inferior vena cava is normal in size with greater than 50% respiratory variability, suggesting right atrial pressure of 3 mmHg. IAS/Shunts: No atrial level shunt detected by color flow Doppler. Additional Comments: 3D was performed not requiring image post processing on an independent  workstation and was indeterminate.  LEFT VENTRICLE PLAX 2D LVIDd:         4.20 cm   Diastology LVIDs:         2.80 cm   LV e' medial:    8.70 cm/s LV PW:         0.90 cm   LV E/e' medial:  7.3 LV IVS:        0.90 cm   LV e' lateral:   11.00 cm/s LVOT diam:     1.80 cm   LV E/e' lateral: 5.8 LV SV:         61 LV SV Index:   37 LVOT Area:     2.54 cm  RIGHT VENTRICLE             IVC RV Basal diam:  2.00 cm     IVC diam: 1.00 cm RV S prime:     16.40 cm/s TAPSE (M-mode): 2.0 cm LEFT ATRIUM             Index        RIGHT ATRIUM          Index LA diam:        3.70 cm 2.23 cm/m   RA Area:     8.91 cm LA Vol (A2C):   44.8 ml 26.98 ml/m  RA Volume:   17.00 ml 10.24 ml/m LA Vol (A4C):   31.3 ml 18.85 ml/m LA Biplane Vol: 37.7 ml 22.70 ml/m  AORTIC VALVE LVOT Vmax:   114.00 cm/s LVOT Vmean:  73.900 cm/s LVOT VTI:    0.241 m  AORTA Ao Root diam: 3.00 cm Ao Asc diam:  2.90 cm MITRAL VALVE               TRICUSPID VALVE MV Area (PHT): 3.03 cm    TR Peak grad:   22.5 mmHg MV Decel Time: 250 msec    TR Vmax:        237.00 cm/s MV E velocity: 63.90 cm/s MV A velocity: 47.70 cm/s  SHUNTS MV E/A ratio:  1.34        Systemic VTI:  0.24 m                            Systemic Diam: 1.80 cm Bonnie Boyden MD Electronically signed by Bonnie Boyden MD Signature Date/Time: 07/27/2023/12:22:28 PM    Final  MR BRAIN WO CONTRAST Result Date: 07/26/2023 CLINICAL DATA:  Provided history: Neuro deficit, acute, stroke suspected. EXAM: MRI HEAD WITHOUT CONTRAST MRA HEAD WITHOUT CONTRAST MRA NECK WITHOUT AND WITH CONTRAST TECHNIQUE: Multiplanar, multi-echo pulse sequences of the brain and surrounding structures were acquired without intravenous contrast. Angiographic images of the Circle of Willis were acquired using MRA technique without intravenous contrast. Angiographic images of the neck were acquired using MRA technique without and with intravenous contrast. Carotid stenosis measurements (when applicable) are obtained utilizing  NASCET criteria, using the distal internal carotid diameter as the denominator. CONTRAST:  7mL GADAVIST GADOBUTROL 1 MMOL/ML IV SOLN COMPARISON:  Non-contrast head CT performed earlier today 07/26/2023. FINDINGS: MRI HEAD FINDINGS Brain: Mild generalized cerebral atrophy. 4 mm acute infarct at the pontomesencephalic junction on the left (series 5, image 19) (series 23, image 20). An additional punctate acute infarct is questioned within the dorsal medulla on the left (series 5, image 11) (versus artifact). Chronic lacunar infarct centered within the genu of right internal capsule. Chronic hemosiderin deposition at this site. Several nonspecific chronic microhemorrhages scattered elsewhere within the supratentorial brain. Moderate multifocal T2 FLAIR hyperintense signal abnormality elsewhere within the cerebral white matter, nonspecific but compatible chronic small vessel ischemic disease. Mild chronic small vessel ischemic changes also present within the [place]. Tiny chronic infarct within the left cerebellar hemisphere (series 18, image 6). No evidence of an intracranial mass. No extra-axial fluid collection. No midline shift. Vascular: Maintained flow voids within the proximal large arterial vessels. Skull and upper cervical spine: No focal worrisome marrow lesion. Incompletely assessed cervical spondylosis. Mild C3-C4 grade 1 anterolisthesis. Sinuses/Orbits: No mass or acute finding within the imaged orbits. Severe left sphenoid sinusitis (with sinus atelectasis). MRA HEAD FINDINGS Moderately motion degraded examination at the level of the circle-of-Willis. Within this limitation, findings are as follows. Anterior circulation: The intracranial internal carotid arteries are patent. The M1 middle cerebral arteries are patent. No M2 proximal branch occlusion is identified. The anterior cerebral arteries are patent. No intracranial aneurysm is identified. Posterior circulation: The intracranial vertebral arteries are  patent. The basilar artery is patent. The posterior cerebral arteries are patent. Apparent severe stenosis within the left PCA proximal P2 segment (series 1051, image 8). Posterior communicating arteries are diminutive or absent, bilaterally. Anatomic variants: As described. MRA NECK FINDINGS Aortic arch: Standard aortic branching. The visualized thoracic aorta is normal in caliber. No hemodynamically significant innominate or proximal subclavian artery stenosis. Right carotid system: CCA and ICA patent within the neck without stenosis. Left carotid system: CCA and ICA patent within the neck without stenosis. Vertebral arteries: Codominant and patent within the neck without stenosis. IMPRESSION: MRI brain: 1. 4 mm acute infarct at the pontomesencephalic junction on the left. 2. Additional punctate acute infarct questioned within the dorsal medulla on the left (versus artifact). 3. Background parenchymal atrophy, chronic small vessel ischemic disease and chronic infarcts, as described. 4. Severe left sphenoid sinusitis (with sinus atelectasis). MRA head: 1. Moderately motion degraded examination. Within this limitation, findings are as follows. 2. No proximal intracranial large vessel occlusion identified. 3. Severe stenosis within the left posterior cerebral artery proximal P2 segment. MRA neck: The common carotid, internal carotid and vertebral arteries are patent within the neck without stenosis. Electronically Signed   By: Bascom Lily D.O.   On: 07/26/2023 16:37   MR ANGIO HEAD WO CONTRAST Result Date: 07/26/2023 CLINICAL DATA:  Provided history: Neuro deficit, acute, stroke suspected. EXAM: MRI HEAD WITHOUT CONTRAST MRA HEAD WITHOUT CONTRAST MRA  NECK WITHOUT AND WITH CONTRAST TECHNIQUE: Multiplanar, multi-echo pulse sequences of the brain and surrounding structures were acquired without intravenous contrast. Angiographic images of the Circle of Willis were acquired using MRA technique without intravenous  contrast. Angiographic images of the neck were acquired using MRA technique without and with intravenous contrast. Carotid stenosis measurements (when applicable) are obtained utilizing NASCET criteria, using the distal internal carotid diameter as the denominator. CONTRAST:  7mL GADAVIST GADOBUTROL 1 MMOL/ML IV SOLN COMPARISON:  Non-contrast head CT performed earlier today 07/26/2023. FINDINGS: MRI HEAD FINDINGS Brain: Mild generalized cerebral atrophy. 4 mm acute infarct at the pontomesencephalic junction on the left (series 5, image 19) (series 23, image 20). An additional punctate acute infarct is questioned within the dorsal medulla on the left (series 5, image 11) (versus artifact). Chronic lacunar infarct centered within the genu of right internal capsule. Chronic hemosiderin deposition at this site. Several nonspecific chronic microhemorrhages scattered elsewhere within the supratentorial brain. Moderate multifocal T2 FLAIR hyperintense signal abnormality elsewhere within the cerebral white matter, nonspecific but compatible chronic small vessel ischemic disease. Mild chronic small vessel ischemic changes also present within the [place]. Tiny chronic infarct within the left cerebellar hemisphere (series 18, image 6). No evidence of an intracranial mass. No extra-axial fluid collection. No midline shift. Vascular: Maintained flow voids within the proximal large arterial vessels. Skull and upper cervical spine: No focal worrisome marrow lesion. Incompletely assessed cervical spondylosis. Mild C3-C4 grade 1 anterolisthesis. Sinuses/Orbits: No mass or acute finding within the imaged orbits. Severe left sphenoid sinusitis (with sinus atelectasis). MRA HEAD FINDINGS Moderately motion degraded examination at the level of the circle-of-Willis. Within this limitation, findings are as follows. Anterior circulation: The intracranial internal carotid arteries are patent. The M1 middle cerebral arteries are patent. No M2  proximal branch occlusion is identified. The anterior cerebral arteries are patent. No intracranial aneurysm is identified. Posterior circulation: The intracranial vertebral arteries are patent. The basilar artery is patent. The posterior cerebral arteries are patent. Apparent severe stenosis within the left PCA proximal P2 segment (series 1051, image 8). Posterior communicating arteries are diminutive or absent, bilaterally. Anatomic variants: As described. MRA NECK FINDINGS Aortic arch: Standard aortic branching. The visualized thoracic aorta is normal in caliber. No hemodynamically significant innominate or proximal subclavian artery stenosis. Right carotid system: CCA and ICA patent within the neck without stenosis. Left carotid system: CCA and ICA patent within the neck without stenosis. Vertebral arteries: Codominant and patent within the neck without stenosis. IMPRESSION: MRI brain: 1. 4 mm acute infarct at the pontomesencephalic junction on the left. 2. Additional punctate acute infarct questioned within the dorsal medulla on the left (versus artifact). 3. Background parenchymal atrophy, chronic small vessel ischemic disease and chronic infarcts, as described. 4. Severe left sphenoid sinusitis (with sinus atelectasis). MRA head: 1. Moderately motion degraded examination. Within this limitation, findings are as follows. 2. No proximal intracranial large vessel occlusion identified. 3. Severe stenosis within the left posterior cerebral artery proximal P2 segment. MRA neck: The common carotid, internal carotid and vertebral arteries are patent within the neck without stenosis. Electronically Signed   By: Bascom Lily D.O.   On: 07/26/2023 16:37   MR ANGIO NECK W WO CONTRAST Result Date: 07/26/2023 CLINICAL DATA:  Provided history: Neuro deficit, acute, stroke suspected. EXAM: MRI HEAD WITHOUT CONTRAST MRA HEAD WITHOUT CONTRAST MRA NECK WITHOUT AND WITH CONTRAST TECHNIQUE: Multiplanar, multi-echo pulse  sequences of the brain and surrounding structures were acquired without intravenous contrast. Angiographic images of the  Circle of Willis were acquired using MRA technique without intravenous contrast. Angiographic images of the neck were acquired using MRA technique without and with intravenous contrast. Carotid stenosis measurements (when applicable) are obtained utilizing NASCET criteria, using the distal internal carotid diameter as the denominator. CONTRAST:  7mL GADAVIST GADOBUTROL 1 MMOL/ML IV SOLN COMPARISON:  Non-contrast head CT performed earlier today 07/26/2023. FINDINGS: MRI HEAD FINDINGS Brain: Mild generalized cerebral atrophy. 4 mm acute infarct at the pontomesencephalic junction on the left (series 5, image 19) (series 23, image 20). An additional punctate acute infarct is questioned within the dorsal medulla on the left (series 5, image 11) (versus artifact). Chronic lacunar infarct centered within the genu of right internal capsule. Chronic hemosiderin deposition at this site. Several nonspecific chronic microhemorrhages scattered elsewhere within the supratentorial brain. Moderate multifocal T2 FLAIR hyperintense signal abnormality elsewhere within the cerebral white matter, nonspecific but compatible chronic small vessel ischemic disease. Mild chronic small vessel ischemic changes also present within the [place]. Tiny chronic infarct within the left cerebellar hemisphere (series 18, image 6). No evidence of an intracranial mass. No extra-axial fluid collection. No midline shift. Vascular: Maintained flow voids within the proximal large arterial vessels. Skull and upper cervical spine: No focal worrisome marrow lesion. Incompletely assessed cervical spondylosis. Mild C3-C4 grade 1 anterolisthesis. Sinuses/Orbits: No mass or acute finding within the imaged orbits. Severe left sphenoid sinusitis (with sinus atelectasis). MRA HEAD FINDINGS Moderately motion degraded examination at the level of the  circle-of-Willis. Within this limitation, findings are as follows. Anterior circulation: The intracranial internal carotid arteries are patent. The M1 middle cerebral arteries are patent. No M2 proximal branch occlusion is identified. The anterior cerebral arteries are patent. No intracranial aneurysm is identified. Posterior circulation: The intracranial vertebral arteries are patent. The basilar artery is patent. The posterior cerebral arteries are patent. Apparent severe stenosis within the left PCA proximal P2 segment (series 1051, image 8). Posterior communicating arteries are diminutive or absent, bilaterally. Anatomic variants: As described. MRA NECK FINDINGS Aortic arch: Standard aortic branching. The visualized thoracic aorta is normal in caliber. No hemodynamically significant innominate or proximal subclavian artery stenosis. Right carotid system: CCA and ICA patent within the neck without stenosis. Left carotid system: CCA and ICA patent within the neck without stenosis. Vertebral arteries: Codominant and patent within the neck without stenosis. IMPRESSION: MRI brain: 1. 4 mm acute infarct at the pontomesencephalic junction on the left. 2. Additional punctate acute infarct questioned within the dorsal medulla on the left (versus artifact). 3. Background parenchymal atrophy, chronic small vessel ischemic disease and chronic infarcts, as described. 4. Severe left sphenoid sinusitis (with sinus atelectasis). MRA head: 1. Moderately motion degraded examination. Within this limitation, findings are as follows. 2. No proximal intracranial large vessel occlusion identified. 3. Severe stenosis within the left posterior cerebral artery proximal P2 segment. MRA neck: The common carotid, internal carotid and vertebral arteries are patent within the neck without stenosis. Electronically Signed   By: Bascom Lily D.O.   On: 07/26/2023 16:37   CT HEAD WO CONTRAST Result Date: 07/26/2023 CLINICAL DATA:  Provided  history: Numbness and tingling around lips. EXAM: CT HEAD WITHOUT CONTRAST TECHNIQUE: Contiguous axial images were obtained from the base of the skull through the vertex without intravenous contrast. RADIATION DOSE REDUCTION: This exam was performed according to the departmental dose-optimization program which includes automated exposure control, adjustment of the mA and/or kV according to patient size and/or use of iterative reconstruction technique. COMPARISON:  None. FINDINGS: Brain: Generalized  cerebral atrophy. Age-indeterminate lacunar infarct centered within the genu of right internal capsule. Mild patchy and ill-defined hypoattenuation elsewhere within the cerebral white matter, nonspecific but compatible with chronic small vessel ischemic disease. There is no acute intracranial hemorrhage. No demarcated cortical infarct. No extra-axial fluid collection. No evidence of an intracranial mass. No midline shift. Vascular: No hyperdense vessel.  Atherosclerotic calcifications. Skull: No calvarial fracture or aggressive osseous lesion. Sinuses/Orbits: No mass or acute finding within the imaged orbits. Severe left sphenoid sinusitis (with complete sinus opacification, sinus atelectasis and chronic reactive osteitis). IMPRESSION: 1. Age-indeterminate lacunar infarct within the genu of the right internal capsule. Consider a brain MRI for further evaluation. 2. Background mild cerebral white matter chronic small vessel ischemic disease. 3. Generalized cerebral atrophy. 4. Severe left sphenoid sinusitis (with sinus atelectasis). Electronically Signed   By: Bascom Lily D.O.   On: 07/26/2023 11:56    Microbiology: Results for orders placed or performed in visit on 05/11/22  Microscopic Examination     Status: None   Collection Time: 05/11/22  3:48 PM   Urine  Result Value Ref Range Status   WBC, UA None seen 0 - 5 /hpf Final   RBC, Urine None seen 0 - 2 /hpf Final   Epithelial Cells (non renal) None seen 0 -  10 /hpf Final   Casts None seen None seen /lpf Final   Bacteria, UA None seen None seen/Few Final    Labs: CBC: Recent Labs  Lab 07/26/23 1118  WBC 6.1  NEUTROABS 3.5  HGB 13.9  HCT 42.9  MCV 96.8  PLT 246   Basic Metabolic Panel: Recent Labs  Lab 07/26/23 1118  NA 135  K 3.7  CL 99  CO2 28  GLUCOSE 156*  BUN 9  CREATININE 0.58  CALCIUM 9.0   Liver Function Tests: Recent Labs  Lab 07/26/23 1118  AST 26  ALT 28  ALKPHOS 74  BILITOT 0.7  PROT 7.4  ALBUMIN 4.0   CBG: Recent Labs  Lab 07/26/23 1023  GLUCAP 156*    Discharge time spent: greater than 30 minutes.  This record has been created using Conservation officer, historic buildings. Errors have been sought and corrected,but may not always be located. Such creation errors do not reflect on the standard of care.   Signed: Luna Salinas, MD [facility] 07/27/2023

## 2023-07-27 NOTE — Discharge Instructions (Signed)
 However neurologist reevaluated you and they were recommending taking baby aspirin  and Plavix together for 46-month and then Plavix only

## 2023-07-27 NOTE — Hospital Course (Addendum)
  Bonnie Washington is a 78 y.o. female with medical history significant of HTN, T2DM, depression,  Presents to the ED for evaluation of perioral numbness and mild dysarthria she first noticed when she woke up this morning.  Admits to some blurred vision as well. No dizziness/imbalance, focal weakness, or changes in sensation peripherally.   On presentation mildly elevated blood pressure otherwise stable vital and labs.CT head revealed age-indeterminate right sided lacunar infarct.   Admitted for stroke workup.  5/21: Blood pressure mildly elevated at 156/73.  MRI with 4 mm acute infarct at the left pontomesencephalic junction.  MRA head and neck was negative for any LVO, did show severe stenosis within the left posterior cerebral artery. Lipid panel with elevated total cholesterol at 252 and LDL of 156-started on high intensity statin.  Recent A1c of 6.4. Started on DAPT with aspirin  and Plavix along with Lipitor 80 mg daily. Echocardiogram normal and PT and OT without any follow-up recommendations.  Patient will continue on DAPT with aspirin  and Plavix for 90 days and then stop taking aspirin  and continue with Plavix. She need to follow-up with outpatient neurology for further assistance.  Patient will continue on current medications and follow-up with her providers for further management.

## 2023-07-27 NOTE — Progress Notes (Signed)
 OT Cancellation Note  Patient Details Name: Bonnie Washington MRN: [id number] DOB: Jul 07, 1945   Cancelled Treatment:    Reason Eval/Treat Not Completed: OT screened, no needs identified, will sign off. Order received, chart reviewed. On arrival pt reports back to baseline functional independence. No skilled OT needs identified. Will sign off. Please re-consult if additional needs arise.   Gordan Latina, M.S. OTR/L  07/27/23, 10:50 AM  ascom [phone]

## 2023-07-27 NOTE — Progress Notes (Signed)
 PT Cancellation Note  Patient Details Name: Bonnie Washington MRN: [id number] DOB: 1945-10-29   Cancelled Treatment:    Reason Eval/Treat Not Completed:  (Consult received and chart reviewed. Noted with evaluation previous date. Per discussion with attending--repeat order; no change in status, no additional needs.  Will discontinue order.  Please reconsult should needs change.)  Jennaya Pogue H. Bevin Bucks, PT, DPT, NCS 07/27/23, 8:39 AM [phone]

## 2023-07-27 NOTE — Progress Notes (Signed)
  Echocardiogram 2D Echocardiogram has been performed.  Bonnie Washington 07/27/2023, 11:28 AM

## 2023-07-27 NOTE — Plan of Care (Addendum)
 Neurology plan of care  This is a 78 year old woman with past medical history significant for hypertension and diabetes who presented to the ED for evaluation of perioral numbness and mild dysarthria.  Results of stroke workup:  MRI brain  1. 4 mm acute infarct at the pontomesencephalic junction on the left. 2. Additional punctate acute infarct questioned within the dorsal medulla on the left (versus artifact). 3. Background parenchymal atrophy, chronic small vessel ischemic disease and chronic infarcts, as described. 4. Severe left sphenoid sinusitis (with sinus atelectasis).   MRA head:   1. Moderately motion degraded examination. Within this limitation, findings are as follows. 2. No proximal intracranial large vessel occlusion identified. 3. Severe stenosis within the left posterior cerebral artery proximal P2 segment.   MRA neck:   The common carotid, internal carotid and vertebral arteries are patent within the neck without stenosis.  TTE 1. Left ventricular ejection fraction, by estimation, is 60 to 65% . The left ventricle has normal function. The left ventricle has no regional wall motion abnormalities. Left ventricular diastolic parameters were normal. 2. Right ventricular systolic function is normal. The right ventricular size is normal. There is normal pulmonary artery systolic pressure. 3. The mitral valve is normal in structure. No evidence of mitral valve regurgitation. No evidence of mitral stenosis. 4. The aortic valve is normal in structure. Aortic valve regurgitation is not visualized. Aortic valve sclerosis is present, with no evidence of aortic valve stenosis. 5. The inferior vena cava is normal in size with greater than 50% respiratory variability, suggesting right atrial pressure of 3 mmHg.  Stroke Labs     Component Value Date/Time   CHOL 252 (H) 07/26/2023 1118   CHOL 246 (H) 07/12/2023 0832   TRIG 109 07/26/2023 1118   HDL 74 07/26/2023 1118   HDL 71  07/12/2023 0832   CHOLHDL 3.4 07/26/2023 1118   VLDL 22 07/26/2023 1118   LDLCALC 156 (H) 07/26/2023 1118   LDLCALC 162 (H) 07/12/2023 0832   LABVLDL 13 07/12/2023 0832    Lab Results  Component Value Date/Time   HGBA1C 6.4 (H) 07/12/2023 08:32 AM   Stroke workup is now completed. Etiology of stroke is favored to be small vessel disease in the setting of hypertension, HL, and DM. Sx have improved per Dr. Ariel Begun she only has slight numbness to the side of her tongue and no other current deficits.  Final recommendations: - ASA 81mg  daily + plavix 75mg  daily x90 days f/b plavix 75mg  daily monotherapy after that  - Atorvastatin 80mg  daily - OK to discharge from neuro standpoint - I will arrange outpatient neurology f/u  Please contact me with any further questions  Greg Leaks, MD [facility] [phone]  If 7pm- 7am, please page neurology on call as listed in AMION.

## 2023-07-28 ENCOUNTER — Encounter

## 2023-08-03 ENCOUNTER — Ambulatory Visit (INDEPENDENT_AMBULATORY_CARE_PROVIDER_SITE_OTHER): Admitting: Nurse Practitioner

## 2023-08-03 ENCOUNTER — Encounter: Payer: Self-pay | Admitting: Nurse Practitioner

## 2023-08-03 VITALS — BP 138/68 | HR 60 | Temp 98.1°F | Resp 16 | Ht 63.0 in | Wt 138.4 lb

## 2023-08-03 DIAGNOSIS — Z8673 Personal history of transient ischemic attack (TIA), and cerebral infarction without residual deficits: Secondary | ICD-10-CM | POA: Diagnosis not present

## 2023-08-03 DIAGNOSIS — I69328 Other speech and language deficits following cerebral infarction: Secondary | ICD-10-CM | POA: Diagnosis not present

## 2023-08-03 DIAGNOSIS — Z09 Encounter for follow-up examination after completed treatment for conditions other than malignant neoplasm: Secondary | ICD-10-CM

## 2023-08-03 NOTE — Progress Notes (Signed)
 [facility] [address] [place] [address] [phone]                                   Transitional Care Clinic   [facility] Discharge Acute Issues Care Follow Up                                                                        Patient Demographics  Bonnie Washington, is a 78 y.o. female  DOB 16-Nov-1945  MRN [id number].  Primary MD  Laurence Pons, NP   Reason for TCC follow Up - recent CVA with some mild changes in speech    Past Medical History:  Diagnosis Date   Depression    Diabetes mellitus without complication (HCC)    pt use to take metformin but has not taken it in 19yrs/lost weight/ stopped metformin/ watches diet   Hypertension    controlled on meds    Past Surgical History:  Procedure Laterality Date   CHOLECYSTECTOMY  2014   FRACTURE SURGERY Right 2004   elbow   KNEE ARTHROSCOPY WITH MEDIAL MENISECTOMY Left 09/21/2018   Procedure: KNEE ARTHROSCOPY WITH MEDIAL AND LATERAL MENISECTOMY, SYNOVECTOMY, CHONDROPLASTY;  Surgeon: Jerlyn Moons, MD;  Location: [facility];  Service: Orthopedics;  Laterality: Left;  Diabetes-diet controlled   OPEN REDUCTION INTERNAL FIXATION (ORIF) DISTAL RADIAL FRACTURE Left 10/06/2015   Procedure: OPEN REDUCTION INTERNAL FIXATION (ORIF) DISTAL RADIAL FRACTURE;  Surgeon: Marlynn Singer, MD;  Location: [facility];  Service: Orthopedics;  Laterality: Left;   VENTRAL HERNIA REPAIR N/A 06/10/2017   Procedure: HERNIA REPAIR VENTRAL ADULT;  Surgeon: Marshall Skeeter, MD;  Location: [facility];  Service: General;  Laterality: N/A;       Recent HPI and Hospital Course  Hospital Course:   Bonnie Washington is a 78 y.o. female with medical history significant of HTN, T2DM, depression,  Presents to the ED for evaluation of perioral numbness and mild dysarthria she first noticed when she woke up this morning.  Admits to some blurred vision as well. No dizziness/imbalance, focal weakness, or  changes in sensation peripherally.    On presentation mildly elevated blood pressure otherwise stable vital and labs.CT head revealed age-indeterminate right sided lacunar infarct.    Admitted for stroke workup.   5/21: Blood pressure mildly elevated at 156/73.  MRI with 4 mm acute infarct at the left pontomesencephalic junction.  MRA head and neck was negative for any LVO, did show severe stenosis within the left posterior cerebral artery. Lipid panel with elevated total cholesterol at 252 and LDL of 156-started on high intensity statin.  Recent A1c of 6.4. Started on DAPT with aspirin  and Plavix  along with Lipitor 80 mg daily. Echocardiogram normal and PT and OT without any follow-up recommendations.   Patient will continue on DAPT with aspirin  and Plavix  for 90 days and then stop taking aspirin  and continue with Plavix . She need to follow-up with outpatient neurology for further assistance.   Patient will continue on current medications and follow-up with her providers for further management.    Post Hospital Acute Care Issue to be followed in the  Clinic   Right-sided lacunar infarction [facility]) -- history of  Active Problems:   Essential hypertension   History of Cerebrovascular accident (CVA) (HCC)   Subjective:   Bonnie Washington today has, No headache, No chest pain, No abdominal pain - No Nausea, No new weakness tingling or numbness, No Cough - SOB. Slight changes in speech. No dysphagia   Assessment & Plan    1. Hospital discharge follow-up (Primary) Repeat labs ordered  - CBC with Differential/Platelet - CMP14+EGFR  2. Speech or language deficit, post-stroke Referred to neurology. Repeat labs ordered  - CBC with Differential/Platelet - CMP14+EGFR - Ambulatory referral to Neurology  3. History of recent stroke Referred to neurology, repeat labs ordered  - CBC with Differential/Platelet - CMP14+EGFR - Ambulatory referral to Neurology   Reason for frequent  admissions/ER visits       Objective:   Vitals:   08/03/23 0946  BP: 138/68  Pulse: 60  Resp: 16  Temp: 98.1 F (36.7 C)  SpO2: 98%  Weight: 138 lb 6.4 oz (62.8 kg)  Height: 5\' 3"  (1.6 m)    Wt Readings from Last 3 Encounters:  08/03/23 138 lb 6.4 oz (62.8 kg)  07/26/23 139 lb 12.4 oz (63.4 kg)  06/20/23 138 lb (62.6 kg)    Allergies as of 08/03/2023       Reactions   Codeine Itching   Okay if takes benadryl along with it        Medication List        Accurate as of Aug 03, 2023 11:59 PM. If you have any questions, ask your nurse or doctor.          ALPRAZolam  0.25 MG tablet Commonly known as: XANAX  Take 1 tablet (0.25 mg total) by mouth 2 (two) times daily as needed for anxiety.   aspirin  EC 81 MG tablet Take 1 tablet (81 mg total) by mouth daily. Swallow whole.   atenolol  25 MG tablet Commonly known as: TENORMIN  Take 1/2 tablet po QPM What changed:  how much to take how to take this when to take this   atorvastatin  80 MG tablet Commonly known as: LIPITOR Take 1 tablet (80 mg total) by mouth daily.   clopidogrel  75 MG tablet Commonly known as: PLAVIX  Take 1 tablet (75 mg total) by mouth daily.   fluticasone  50 MCG/ACT nasal spray Commonly known as: FLONASE  PLACE 1 SPRAY IN EACH NOSTRIL AT BEDTIME   multivitamin with minerals Tabs tablet Take 1 tablet by mouth daily.   venlafaxine  XR 75 MG 24 hr capsule Commonly known as: EFFEXOR -XR TAKE THREE CAPSULES BY MOUTH ONCE DAILY         Physical Exam: Constitutional: Patient appears well-developed and well-nourished. Not in obvious distress. HENT: Normocephalic, atraumatic, External right and left ear normal. Oropharynx is clear and moist.  Eyes: Conjunctivae and EOM are normal. PERRLA, no scleral icterus. Neck: Normal ROM. Neck supple. No JVD. No tracheal deviation. No thyromegaly. CVS: RRR, S1/S2 +, no murmurs, no gallops, no carotid bruit.  Pulmonary: Effort and breath sounds normal,  no stridor, rhonchi, wheezes, rales.  Abdominal: Soft. BS +, no distension, tenderness, rebound or guarding.  Musculoskeletal: Normal range of motion. No edema and no tenderness.  Lymphadenopathy: No lymphadenopathy noted, cervical, inguinal or axillary Neuro: Alert. Normal reflexes, muscle tone coordination. No cranial nerve deficit. Skin: Skin is warm and dry. No rash noted. Not diaphoretic. No erythema. No pallor. Psychiatric: Normal mood and affect. Behavior, judgment, thought content normal.  Data Review   Micro Results No results found for this or any previous visit (from the past 240 hours).   CBC No results for input(s): "WBC", "HGB", "HCT", "PLT", "MCV", "MCH", "MCHC", "RDW", "LYMPHSABS", "MONOABS", "EOSABS", "BASOSABS", "BANDABS" in the last 168 hours.  Invalid input(s): "NEUTRABS", "BANDSABD"  Chemistries  No results for input(s): "NA", "K", "CL", "CO2", "GLUCOSE", "BUN", "CREATININE", "CALCIUM ", "MG", "AST", "ALT", "ALKPHOS", "BILITOT" in the last 168 hours.  Invalid input(s): "GFRCGP" ------------------------------------------------------------------------------------------------------------------ estimated creatinine clearance is 49.5 mL/min (by C-G formula based on SCr of 0.58 mg/dL). ------------------------------------------------------------------------------------------------------------------ No results for input(s): "HGBA1C" in the last 72 hours. ------------------------------------------------------------------------------------------------------------------ No results for input(s): "CHOL", "HDL", "LDLCALC", "TRIG", "CHOLHDL", "LDLDIRECT" in the last 72 hours. ------------------------------------------------------------------------------------------------------------------ No results for input(s): "TSH", "T4TOTAL", "T3FREE", "THYROIDAB" in the last 72 hours.  Invalid input(s):  "FREET3" ------------------------------------------------------------------------------------------------------------------ No results for input(s): "VITAMINB12", "FOLATE", "FERRITIN", "TIBC", "IRON", "RETICCTPCT" in the last 72 hours.  Coagulation profile No results for input(s): "INR", "PROTIME" in the last 168 hours.  No results for input(s): "DDIMER" in the last 72 hours.  Cardiac Enzymes No results for input(s): "CKMB", "TROPONINI", "MYOGLOBIN" in the last 168 hours.  Invalid input(s): "CK" ------------------------------------------------------------------------------------------------------------------ Invalid input(s): "POCBNP"  Return in about 6 months (around 02/03/2024) for F/U, Meshach Perry PCP and neurology referral ordered. .   Time Spent in minutes  45 Time spent with patient included reviewing progress notes, labs, imaging studies, and discussing plan for follow up.   This patient was seen by Laurence Pons, FNP-C in collaboration with Dr. Verneta Gone as a part of collaborative care agreement.    Laurence Pons MSN, FNP-C on 08/03/2023 at 10:18 AM   **Disclaimer: This note may have been dictated with voice recognition software. Similar sounding words can inadvertently be transcribed and this note may contain transcription errors which may not have been corrected upon publication of note.**

## 2023-08-04 ENCOUNTER — Telehealth: Payer: Self-pay | Admitting: Nurse Practitioner

## 2023-08-04 NOTE — Telephone Encounter (Signed)
 Awaiting 08/03/23 office notes for Neurology referral-Toni

## 2023-08-10 ENCOUNTER — Telehealth: Payer: Self-pay | Admitting: Nurse Practitioner

## 2023-08-10 NOTE — Telephone Encounter (Signed)
 Urgent Neurology referral sent via [employer] to Dr. Mason Sole w/ [facility].  Notified patient. Gave pt telephone# [phone]-Toni

## 2023-08-11 ENCOUNTER — Encounter

## 2023-08-17 ENCOUNTER — Telehealth: Payer: Self-pay | Admitting: Nurse Practitioner

## 2023-08-17 NOTE — Telephone Encounter (Signed)
 Neurology appointment 10/31/2023 @ [facility]

## 2023-08-29 ENCOUNTER — Other Ambulatory Visit: Payer: Self-pay

## 2023-08-29 DIAGNOSIS — I10 Essential (primary) hypertension: Secondary | ICD-10-CM

## 2023-08-29 MED ORDER — ATENOLOL 25 MG PO TABS: ORAL_TABLET | ORAL | 1 refills | Status: DC

## 2023-09-06 ENCOUNTER — Ambulatory Visit: Admitting: Diagnostic Neuroimaging

## 2023-09-11 NOTE — Progress Notes (Unsigned)
 [facility]  PATIENT: Bonnie Washington DOB: 04-05-1945  REFERRING DOCTOR OR PCP:  Dr. Caleen; Mardy Maxin, NP SOURCE: Patient, notes from recent hospitalization, imaging and lab reports, MRI and CT scan images personally reviewed.  _________________________________   HISTORICAL  CHIEF COMPLAINT:  Chief Complaint  Patient presents with   New Patient (Initial Visit)    Pt in room 11. Bonnie Washington daughter in room.New patient internal referral for Right-sided lacunar infarction. Pt reports feeling fine, no weakness. No headaches.     HISTORY OF PRESENT ILLNESS:  I had the pleasure to see your patient, Bonnie Washington, at [facility] for neurologic consultation regarding her recent stroke.  She is a 78 year old woman with hypertension, hyperlipidemia and borderline diabetes mellitus who presented on 07/26/2023 with perioral numbness, right eye numbness, poor balance and mild dysarthria. She drover to her daughters house and then ambulance was called due to concern of stroke.    In the ED,  MRI of the brain suggested an acute stroke at the left pontomesencephalic junction.  MR angiogram showed stenosis of the P2 segment of the left posterior cerebral artery.  She was admitted.  Echocardiogram did not show any embolic source.  She improved while in the hospital and resolved over the next week.  She was discharged on aspirin  81 mg p.o. daily, Plavix  75 mg p.o. daily,for 3 weeks and is now just on Plavix  75 mg p.o. daily monotherapy.  Additionally atorvastatin  80 mg was prescribed (TChol was 246 and LDL was 162 on 07/12/2023).  Daughter notes a tic where she clears her throat multiple times a day, worse since the stroke.     Currently gait is doing well   She uses the bannister.   No weakness or numbness.   Feet are often cold and become darker at times.   Vascular U/S was normal in 3/20223.  She does not snore  Vascular risk factors :  Borderline NIDDM (HgbA1c has been  slightly high x > 5 years; not on med's), HLD (recently diagnosed now on Lipitor, Hypertension (on atenolol ), tobacco (20 years 1/2 ppd) .     Imaging 07/26/2023 was personally reviewed.: MRI brain: I reviewed, there is mild DWI hyperintensity at the left pontomesencephalic junction concerning for an acute stroke additionally there is scattered T2/FLAIR hyperintense foci in the basal ganglia, right greater than left and in the subcortical and deep white matter of the cerebral hemispheres consistent with moderate chronic microvascular ischemic change.  There is mild generalized cortical atrophy, typical for age.  Left sphenoid sinusitis is noted.     MRA head: No large vessel occlusion is noted.  There is stenosis of the P2 segment of the left posterior cerebral artery.  MRA neck:  The common carotid, internal carotid and vertebral arteries are patent within the neck without stenosis.   TTE report 1. Left ventricular ejection fraction, by estimation, is 60 to 65% . The left ventricle has normal function. The left ventricle has no regional wall motion abnormalities. Left ventricular diastolic parameters were normal. 2. Right ventricular systolic function is normal. The right ventricular size is normal. There is normal pulmonary artery systolic pressure. 3. The mitral valve is normal in structure. No evidence of mitral valve regurgitation. No evidence of mitral stenosis. 4. The aortic valve is normal in structure. Aortic valve regurgitation is not visualized. Aortic valve sclerosis is present, with no evidence of aortic valve stenosis. 5. The inferior vena cava is normal in size with greater than  50% respiratory variability, suggesting right atrial pressure of 3 mmHg.      REVIEW OF SYSTEMS: Constitutional: No fevers, chills, sweats, or change in appetite Eyes: No visual changes, double vision, eye pain Ear, nose and throat: She has reduced hearing. No ear pain, nasal congestion, sore  throat Cardiovascular: No chest pain, palpitations Respiratory:  No shortness of breath at rest or with exertion.   No wheezes GastrointestinaI: No nausea, vomiting, diarrhea, abdominal pain, fecal incontinence Genitourinary:  No dysuria, urinary retention or frequency.  No nocturia. Musculoskeletal:  No neck pain, back pain Integumentary: No rash, pruritus, skin lesions Neurological: as above Psychiatric: No depression at this time.  No anxiety Endocrine: No palpitations, diaphoresis, change in appetite, change in weigh or increased thirst Hematologic/Lymphatic:  No anemia, purpura, petechiae. Allergic/Immunologic: No itchy/runny eyes, nasal congestion, recent allergic reactions, rashes  ALLERGIES: Allergies  Allergen Reactions   Codeine Itching    Okay if takes benadryl along with it    HOME MEDICATIONS:  Current Outpatient Medications:    atenolol  (TENORMIN ) 25 MG tablet, Take 1/2 tablet po QPM, Disp: 90 tablet, Rfl: 1   atorvastatin  (LIPITOR) 80 MG tablet, Take 1 tablet (80 mg total) by mouth daily., Disp: 90 tablet, Rfl: 1   clopidogrel  (PLAVIX ) 75 MG tablet, Take 1 tablet (75 mg total) by mouth daily., Disp: 90 tablet, Rfl: 1   fluticasone  (FLONASE ) 50 MCG/ACT nasal spray, PLACE 1 SPRAY IN EACH NOSTRIL AT BEDTIME, Disp: 16 g, Rfl: 11   Multiple Vitamin (MULTIVITAMIN WITH MINERALS) TABS tablet, Take 1 tablet by mouth daily., Disp: , Rfl:    venlafaxine  XR (EFFEXOR -XR) 75 MG 24 hr capsule, TAKE THREE CAPSULES BY MOUTH ONCE DAILY, Disp: 270 capsule, Rfl: 3   ALPRAZolam  (XANAX ) 0.25 MG tablet, Take 1 tablet (0.25 mg total) by mouth 2 (two) times daily as needed for anxiety., Disp: 30 tablet, Rfl: 0   aspirin  EC 81 MG tablet, Take 1 tablet (81 mg total) by mouth daily. Swallow whole. (Patient not taking: Reported on 09/12/2023), Disp: 90 tablet, Rfl: 0  PAST MEDICAL HISTORY: Past Medical History:  Diagnosis Date   Depression    Diabetes mellitus without complication (HCC)    pt  use to take metformin but has not taken it in 67yrs/lost weight/ stopped metformin/ watches diet   Hypertension    controlled on meds    PAST SURGICAL HISTORY: Past Surgical History:  Procedure Laterality Date   CHOLECYSTECTOMY  2014   FRACTURE SURGERY Right 2004   elbow   KNEE ARTHROSCOPY WITH MEDIAL MENISECTOMY Left 09/21/2018   Procedure: KNEE ARTHROSCOPY WITH MEDIAL AND LATERAL MENISECTOMY, SYNOVECTOMY, CHONDROPLASTY;  Surgeon: Leora Lynwood SAUNDERS, MD;  Location: [facility];  Service: Orthopedics;  Laterality: Left;  Diabetes-diet controlled   OPEN REDUCTION INTERNAL FIXATION (ORIF) DISTAL RADIAL FRACTURE Left 10/06/2015   Procedure: OPEN REDUCTION INTERNAL FIXATION (ORIF) DISTAL RADIAL FRACTURE;  Surgeon: Kayla Pinal, MD;  Location: [facility];  Service: Orthopedics;  Laterality: Left;   VENTRAL HERNIA REPAIR N/A 06/10/2017   Procedure: HERNIA REPAIR VENTRAL ADULT;  Surgeon: Dessa Reyes ORN, MD;  Location: [facility];  Service: General;  Laterality: N/A;    FAMILY HISTORY: Family History  Problem Relation Age of Onset   Hypertension Mother    Breast cancer Cousin     SOCIAL HISTORY: Social History   Socioeconomic History   Marital status: Married    Spouse name: Not on file   Number of children: Not on file   Years of  education: Not on file   Highest education level: Not on file  Occupational History   Not on file  Tobacco Use   Smoking status: Former    Current packs/day: 0.00    Average packs/day: 0.5 packs/day for 20.0 years (10.0 ttl pk-yrs)    Types: Cigarettes    Start date: 03/08/1973    Quit date: 03/08/1993    Years since quitting: 30.5   Smokeless tobacco: Never  Vaping Use   Vaping status: Never Used  Substance and Sexual Activity   Alcohol use: Yes    Alcohol/week: 7.0 standard drinks of alcohol    Types: 7 Cans of beer per week    Comment: occasionally   Drug use: No   Sexual activity: Not on file  Other Topics Concern   Not on file  Social  History Narrative   Not on file   Social Drivers of Health   Financial Resource Strain: Low Risk  (08/12/2020)   Overall Financial Resource Strain (CARDIA)    Difficulty of Paying Living Expenses: Not very hard  Food Insecurity: Not on file  Transportation Needs: Not on file  Physical Activity: Not on file  Stress: Not on file  Social Connections: Not on file  Intimate Partner Violence: Not on file       PHYSICAL EXAM  Vitals:   09/12/23 0959  BP: 115/65  Pulse: 60  SpO2: 98%  Weight: 142 lb 8 oz (64.6 kg)  Height: 5' 3 (1.6 m)    Body mass index is 25.24 kg/m.   General: The patient is well-developed and well-nourished and in no acute distress  HEENT:  Head is Stella/AT.  Sclera are anicteric.  Funduscopic exam shows normal optic discs and retinal vessels.  Neck: No carotid bruits are noted.  The neck is nontender.  Cardiovascular: The heart has a regular rate and rhythm with a normal S1 and S2. There were no murmurs, gallops or rubs.    Skin: Extremities are without rash or  edema.  She has a dusky appearance of her toes  Musculoskeletal:  Back is nontender  Neurologic Exam  Mental status: The patient is alert and oriented x 3 at the time of the examination. The patient has apparent normal recent and remote memory, with an apparently normal attention span and concentration ability.   Speech is normal.  Cranial nerves: Extraocular movements are full. Pupils are equal, round, and reactive to light and accomodation.  Visual fields are full.  Facial symmetry is present. There is good facial sensation to soft touch bilaterally.Facial strength is normal.  Trapezius and sternocleidomastoid strength is normal. No dysarthria is noted.  The tongue is midline, and the patient has symmetric elevation of the soft palate. No obvious hearing deficits are noted.  Motor:  Muscle bulk is normal.   Tone is normal. Strength is  5 / 5 in all 4 extremities.   Sensory: Sensory testing is  intact to pinprick, soft touch and vibration sensation in arms, hands and proximal legs.  However, she has reduced sensation to vibration in the toes.  Preserved touch sensation.  Coordination: Cerebellar testing reveals good finger-nose-finger and heel-to-shin bilaterally.  Gait and station: Station is normal.   Gait is normal. Tandem gait is normal. Romberg is negative.   Reflexes: Deep tendon reflexes are symmetric and normal bilaterally.   Plantar responses are flexor.    DIAGNOSTIC DATA (LABS, IMAGING, TESTING) - I reviewed patient records, labs, notes, testing and imaging myself where available.  Lab Results  Component Value Date   WBC 6.1 07/26/2023   HGB 13.9 07/26/2023   HCT 42.9 07/26/2023   MCV 96.8 07/26/2023   PLT 246 07/26/2023      Component Value Date/Time   NA 135 07/26/2023 1118   NA 136 07/12/2023 0832   K 3.7 07/26/2023 1118   CL 99 07/26/2023 1118   CO2 28 07/26/2023 1118   GLUCOSE 156 (H) 07/26/2023 1118   BUN 9 07/26/2023 1118   BUN 9 07/12/2023 0832   CREATININE 0.58 07/26/2023 1118   CALCIUM  9.0 07/26/2023 1118   PROT 7.4 07/26/2023 1118   PROT 6.6 07/12/2023 0832   ALBUMIN 4.0 07/26/2023 1118   ALBUMIN 4.3 07/12/2023 0832   AST 26 07/26/2023 1118   ALT 28 07/26/2023 1118   ALKPHOS 74 07/26/2023 1118   BILITOT 0.7 07/26/2023 1118   BILITOT 0.5 07/12/2023 0832   GFRNONAA >60 07/26/2023 1118   GFRAA 103 07/10/2019 1021   Lab Results  Component Value Date   CHOL 252 (H) 07/26/2023   HDL 74 07/26/2023   LDLCALC 156 (H) 07/26/2023   TRIG 109 07/26/2023   CHOLHDL 3.4 07/26/2023   Lab Results  Component Value Date   HGBA1C 6.4 (H) 07/12/2023   Lab Results  Component Value Date   VITAMINB12 443 07/12/2023   Lab Results  Component Value Date   TSH 0.681 07/26/2023       ASSESSMENT AND PLAN  Lacunar stroke (HCC)  Essential hypertension  Decreased hearing of both ears  Diabetes mellitus without complication  (HCC)  Hyperlipidemia, unspecified hyperlipidemia type  Slurred speech   In summary, Bonnie Washington is a 78 year old woman who had a small lacunar stroke causing perioral numbness, slurred speech and possibly mild reduced balance.  The symptoms resolved over the next week and she is back at baseline.  MR angiogram showed narrowing of the P2 segment of the left posterior cerebral artery but this is not in a distribution that would have caused this stroke.  She had been placed on aspirin  and Plavix  and continues on Plavix .  I agree with this plan.  Additionally we discussed the importance of lowering all of her stroke risk factors.  Blood pressure seems to be well-controlled on atenolol .  Hyperlipidemia will likely resolve on Lipitor.  She has had mildly elevated hemoglobin A1c readings and likely has mild diabetes.  She had been on metformin in the past and is not sure why it was discontinued.  I advised her to discuss this further with her primary care provider as we would like to reduce this risk factor as well.  She has a history of smoking but has not done so for 30 years.  She will return to see me as needed if she has significant new or worsening neurologic symptoms.  Thank you for asking me to see this patient.  Please let me know if I can be of further assistance with her or other patients in the future.  Renel Ende A. Vear, MD, [facility] 09/12/2023, 11:02 AM Certified in Neurology, Clinical Neurophysiology, Sleep Medicine and Neuroimaging  [facility] [address], [address] [place], [place] [address] [phone]

## 2023-09-12 ENCOUNTER — Ambulatory Visit: Admitting: Neurology

## 2023-09-12 ENCOUNTER — Telehealth: Payer: Self-pay | Admitting: Neurology

## 2023-09-12 ENCOUNTER — Encounter: Payer: Self-pay | Admitting: Neurology

## 2023-09-12 VITALS — BP 115/65 | HR 60 | Ht 63.0 in | Wt 142.5 lb

## 2023-09-12 DIAGNOSIS — I10 Essential (primary) hypertension: Secondary | ICD-10-CM

## 2023-09-12 DIAGNOSIS — I6381 Other cerebral infarction due to occlusion or stenosis of small artery: Secondary | ICD-10-CM | POA: Diagnosis not present

## 2023-09-12 DIAGNOSIS — H9193 Unspecified hearing loss, bilateral: Secondary | ICD-10-CM

## 2023-09-12 DIAGNOSIS — R4781 Slurred speech: Secondary | ICD-10-CM | POA: Diagnosis not present

## 2023-09-12 DIAGNOSIS — E785 Hyperlipidemia, unspecified: Secondary | ICD-10-CM | POA: Diagnosis not present

## 2023-09-12 DIAGNOSIS — E119 Type 2 diabetes mellitus without complications: Secondary | ICD-10-CM

## 2023-09-12 NOTE — Telephone Encounter (Signed)
 [place]. Dr. Vear approved for patient to be seen

## 2023-10-04 ENCOUNTER — Telehealth: Payer: Self-pay

## 2023-10-10 MED ORDER — METFORMIN HCL ER 500 MG PO TB24: 500.0000 mg | ORAL_TABLET | Freq: Every day | ORAL | 1 refills | Status: DC

## 2023-10-10 NOTE — Telephone Encounter (Signed)
 Patient notified

## 2023-11-28 NOTE — Progress Notes (Signed)
   11/28/2023  Patient ID: Avel SHAUNNA Fredericks, female   DOB: 1945-10-09, 78 y.o.   MRN: [id number]  Pharmacy Quality Measure Review  This patient is appearing on a report for being at risk of failing the adherence measure for diabetes medications this calendar year.   Medication: Metformin  Last fill date: 10/10/23 for 90 day supply  Insurance report was not up to date. No action needed at this time.   Jon VEAR Lindau, PharmD Clinical Pharmacist [phone]

## 2023-12-15 DIAGNOSIS — I69328 Other speech and language deficits following cerebral infarction: Secondary | ICD-10-CM | POA: Diagnosis not present

## 2023-12-15 DIAGNOSIS — Z8673 Personal history of transient ischemic attack (TIA), and cerebral infarction without residual deficits: Secondary | ICD-10-CM | POA: Diagnosis not present

## 2023-12-15 DIAGNOSIS — Z09 Encounter for follow-up examination after completed treatment for conditions other than malignant neoplasm: Secondary | ICD-10-CM | POA: Diagnosis not present

## 2023-12-16 LAB — CMP14+EGFR
ALT: 11 IU/L (ref 0–32)
AST: 15 IU/L (ref 0–40)
Albumin: 4.4 g/dL (ref 3.8–4.8)
Alkaline Phosphatase: 94 IU/L (ref 49–135)
BUN/Creatinine Ratio: 10 — ABNORMAL LOW (ref 12–28)
BUN: 6 mg/dL — ABNORMAL LOW (ref 8–27)
Bilirubin Total: 0.4 mg/dL (ref 0.0–1.2)
CO2: 23 mmol/L (ref 20–29)
Calcium: 9.3 mg/dL (ref 8.7–10.3)
Chloride: 94 mmol/L — ABNORMAL LOW (ref 96–106)
Creatinine, Ser: 0.61 mg/dL (ref 0.57–1.00)
Globulin, Total: 2 g/dL (ref 1.5–4.5)
Glucose: 133 mg/dL — ABNORMAL HIGH (ref 70–99)
Potassium: 4.6 mmol/L (ref 3.5–5.2)
Sodium: 132 mmol/L — ABNORMAL LOW (ref 134–144)
Total Protein: 6.4 g/dL (ref 6.0–8.5)
eGFR: 93 mL/min/1.73 (ref >59)

## 2023-12-16 LAB — CBC WITH DIFFERENTIAL/PLATELET
Basophils Absolute: 0 x10E3/uL (ref 0.0–0.2)
Basos: 1 %
EOS (ABSOLUTE): 0.1 x10E3/uL (ref 0.0–0.4)
Eos: 1 %
Hematocrit: 41.5 % (ref 34.0–46.6)
Hemoglobin: 13.7 g/dL (ref 11.1–15.9)
Immature Grans (Abs): 0 x10E3/uL (ref 0.0–0.1)
Immature Granulocytes: 0 %
Lymphocytes Absolute: 1.7 x10E3/uL (ref 0.7–3.1)
Lymphs: 30 %
MCH: 32.8 pg (ref 26.6–33.0)
MCHC: 33 g/dL (ref 31.5–35.7)
MCV: 99 fL — ABNORMAL HIGH (ref 79–97)
Monocytes Absolute: 0.4 x10E3/uL (ref 0.1–0.9)
Monocytes: 8 %
Neutrophils Absolute: 3.4 x10E3/uL (ref 1.4–7.0)
Neutrophils: 60 %
Platelets: 287 x10E3/uL (ref 150–450)
RBC: 4.18 x10E6/uL (ref 3.77–5.28)
RDW: 12.1 % (ref 11.7–15.4)
WBC: 5.6 x10E3/uL (ref 3.4–10.8)

## 2023-12-19 ENCOUNTER — Ambulatory Visit: Admitting: Nurse Practitioner

## 2023-12-21 ENCOUNTER — Ambulatory Visit: Admitting: Nurse Practitioner

## 2023-12-21 ENCOUNTER — Encounter: Payer: Self-pay | Admitting: Nurse Practitioner

## 2023-12-21 VITALS — BP 120/70 | HR 60 | Temp 96.7°F | Resp 16 | Ht 63.0 in | Wt 141.0 lb

## 2023-12-21 DIAGNOSIS — G720 Drug-induced myopathy: Secondary | ICD-10-CM

## 2023-12-21 DIAGNOSIS — Z8673 Personal history of transient ischemic attack (TIA), and cerebral infarction without residual deficits: Secondary | ICD-10-CM

## 2023-12-21 DIAGNOSIS — E785 Hyperlipidemia, unspecified: Secondary | ICD-10-CM

## 2023-12-21 DIAGNOSIS — I152 Hypertension secondary to endocrine disorders: Secondary | ICD-10-CM | POA: Diagnosis not present

## 2023-12-21 DIAGNOSIS — E1169 Type 2 diabetes mellitus with other specified complication: Secondary | ICD-10-CM

## 2023-12-21 DIAGNOSIS — T466X5A Adverse effect of antihyperlipidemic and antiarteriosclerotic drugs, initial encounter: Secondary | ICD-10-CM

## 2023-12-21 DIAGNOSIS — I75023 Atheroembolism of bilateral lower extremities: Secondary | ICD-10-CM

## 2023-12-21 DIAGNOSIS — I10 Essential (primary) hypertension: Secondary | ICD-10-CM

## 2023-12-21 DIAGNOSIS — E1159 Type 2 diabetes mellitus with other circulatory complications: Secondary | ICD-10-CM

## 2023-12-21 DIAGNOSIS — R7303 Prediabetes: Secondary | ICD-10-CM | POA: Diagnosis not present

## 2023-12-21 LAB — POCT GLYCOSYLATED HEMOGLOBIN (HGB A1C): Hemoglobin A1C: 5.9 % — AB (ref 4.0–5.6)

## 2023-12-21 MED ORDER — CLOPIDOGREL BISULFATE 75 MG PO TABS: 75.0000 mg | ORAL_TABLET | Freq: Every day | ORAL | 1 refills | Status: AC

## 2023-12-21 MED ORDER — METFORMIN HCL ER 500 MG PO TB24
500.0000 mg | ORAL_TABLET | Freq: Every day | ORAL | 1 refills | Status: AC
Start: 2023-12-21 — End: ?

## 2023-12-21 MED ORDER — ATENOLOL 25 MG PO TABS: ORAL_TABLET | ORAL | 1 refills | Status: AC

## 2023-12-21 NOTE — Progress Notes (Signed)
 [facility] [address] [place], [place] [address]  Internal MEDICINE  Office Visit Note  Patient Name: Bonnie Washington  [id number]  [id number]  Date of Service: 12/28/2023  Chief Complaint  Patient presents with   Depression   Diabetes   Hypertension   Follow-up    HPI Zelma presents for a follow-up visit for diabetes high cholesterol, hypertension and history of stroke.  Type 2 diabetes -- current A1c is 5.9, controlled with metformin .  High cholesterol -- was started on atorvastatin  80 mg but did not tolerate it due to the medication causing her muscles to be painful and sore. History of stroke -- takes plavix  but stopped taking the statin due to side effects.  Hypertension -- controlled with current medication  Has prior ABI showing poor circulation in both legs from 2023.     Current Medication: Outpatient Encounter Medications as of 12/21/2023  Medication Sig Note   atenolol  (TENORMIN ) 25 MG tablet Take 1/2 tablet po QPM    clopidogrel  (PLAVIX ) 75 MG tablet Take 1 tablet (75 mg total) by mouth daily.    fluticasone  (FLONASE ) 50 MCG/ACT nasal spray PLACE 1 SPRAY IN EACH NOSTRIL AT BEDTIME 07/26/2023: PRN   metFORMIN  (GLUCOPHAGE -XR) 500 MG 24 hr tablet Take 1 tablet (500 mg total) by mouth daily with breakfast.    Multiple Vitamin (MULTIVITAMIN WITH MINERALS) TABS tablet Take 1 tablet by mouth daily.    venlafaxine  XR (EFFEXOR -XR) 75 MG 24 hr capsule TAKE THREE CAPSULES BY MOUTH ONCE DAILY    [DISCONTINUED] atenolol  (TENORMIN ) 25 MG tablet Take 1/2 tablet po QPM    [DISCONTINUED] atorvastatin  (LIPITOR) 80 MG tablet Take 1 tablet (80 mg total) by mouth daily.    [DISCONTINUED] clopidogrel  (PLAVIX ) 75 MG tablet Take 1 tablet (75 mg total) by mouth daily. (Patient not taking: Reported on 12/21/2023)    [DISCONTINUED] metFORMIN  (GLUCOPHAGE -XR) 500 MG 24 hr tablet Take 1 tablet (500 mg total) by mouth daily with breakfast.    No facility-administered encounter  medications on file as of 12/21/2023.    Surgical History: Past Surgical History:  Procedure Laterality Date   CHOLECYSTECTOMY  2014   FRACTURE SURGERY Right 2004   elbow   KNEE ARTHROSCOPY WITH MEDIAL MENISECTOMY Left 09/21/2018   Procedure: KNEE ARTHROSCOPY WITH MEDIAL AND LATERAL MENISECTOMY, SYNOVECTOMY, CHONDROPLASTY;  Surgeon: Leora Lynwood SAUNDERS, MD;  Location: [facility];  Service: Orthopedics;  Laterality: Left;  Diabetes-diet controlled   OPEN REDUCTION INTERNAL FIXATION (ORIF) DISTAL RADIAL FRACTURE Left 10/06/2015   Procedure: OPEN REDUCTION INTERNAL FIXATION (ORIF) DISTAL RADIAL FRACTURE;  Surgeon: Kayla Pinal, MD;  Location: [facility];  Service: Orthopedics;  Laterality: Left;   VENTRAL HERNIA REPAIR N/A 06/10/2017   Procedure: HERNIA REPAIR VENTRAL ADULT;  Surgeon: Dessa Reyes ORN, MD;  Location: [facility];  Service: General;  Laterality: N/A;    Medical History: Past Medical History:  Diagnosis Date   Depression    Diabetes mellitus without complication (HCC)    pt use to take metformin  but has not taken it in 65yrs/lost weight/ stopped metformin / watches diet   Hypertension    controlled on meds   Ventral hernia without obstruction or gangrene 05/18/2017    Family History: Family History  Problem Relation Age of Onset   Hypertension Mother    Breast cancer Cousin     Social History   Socioeconomic History   Marital status: Married    Spouse name: Not on file   Number of children: Not on  file   Years of education: Not on file   Highest education level: Not on file  Occupational History   Not on file  Tobacco Use   Smoking status: Former    Current packs/day: 0.00    Average packs/day: 0.5 packs/day for 20.0 years (10.0 ttl pk-yrs)    Types: Cigarettes    Start date: 03/08/1973    Quit date: 03/08/1993    Years since quitting: 30.8   Smokeless tobacco: Never  Vaping Use   Vaping status: Never Used  Substance and Sexual Activity   Alcohol use: Yes     Alcohol/week: 7.0 standard drinks of alcohol    Types: 7 Cans of beer per week    Comment: occasionally   Drug use: No   Sexual activity: Not on file  Other Topics Concern   Not on file  Social History Narrative   Not on file   Social Drivers of Health   Financial Resource Strain: Low Risk  (08/12/2020)   Overall Financial Resource Strain (CARDIA)    Difficulty of Paying Living Expenses: Not very hard  Food Insecurity: Not on file  Transportation Needs: Not on file  Physical Activity: Not on file  Stress: Not on file  Social Connections: Not on file  Intimate Partner Violence: Not on file      Review of Systems  Constitutional:  Negative for chills, fatigue and unexpected weight change.  HENT:  Negative for congestion, rhinorrhea, sneezing and sore throat.   Eyes:  Negative for redness.  Respiratory:  Negative for cough, chest tightness and shortness of breath.   Cardiovascular:  Negative for chest pain and palpitations.  Gastrointestinal:  Negative for abdominal pain, constipation, diarrhea, nausea and vomiting.  Genitourinary:  Negative for dysuria and frequency.  Musculoskeletal:  Negative for arthralgias, back pain, joint swelling and neck pain.  Skin:  Negative for rash.  Neurological: Negative.  Negative for tremors and numbness.  Hematological:  Negative for adenopathy. Does not bruise/bleed easily.  Psychiatric/Behavioral:  Negative for behavioral problems (Depression), sleep disturbance and suicidal ideas. The patient is not nervous/anxious.     Vital Signs: BP 120/70   Pulse 60   Temp (!) 96.7 F (35.9 C)   Resp 16   Ht 5' 3 (1.6 m)   Wt 141 lb (64 kg)   SpO2 97%   BMI 24.98 kg/m    Physical Exam Vitals reviewed.  Constitutional:      General: She is not in acute distress.    Appearance: Normal appearance. She is not ill-appearing.  HENT:     Head: Normocephalic and atraumatic.  Eyes:     Pupils: Pupils are equal, round, and reactive to light.   Cardiovascular:     Rate and Rhythm: Normal rate and regular rhythm.  Pulmonary:     Effort: Pulmonary effort is normal. No respiratory distress.  Feet:     Comments: Bilateral soles of feet are discolored a purplish-blue that is blanchable. The color deepens the longer she hangs her legs down while she is sitting in a chair.  Neurological:     Mental Status: She is alert and oriented to person, place, and time.  Psychiatric:        Mood and Affect: Mood normal.        Behavior: Behavior normal.        Assessment/Plan: 1. Type 2 diabetes mellitus with other specified complication, without long-term current use of insulin (HCC) (Primary) A1c is stable at 5.9. continue metformin   as prescribed. Urine sent for microalbumin creatinine ratio.  - POCT glycosylated hemoglobin (Hb A1C) - metFORMIN  (GLUCOPHAGE -XR) 500 MG 24 hr tablet; Take 1 tablet (500 mg total) by mouth daily with breakfast.  Dispense: 90 tablet; Refill: 1 - Urine Microalbumin w/creat. ratio  2. Hypertension associated with diabetes (HCC) Stable, continue atenolol  as prescribed. - atenolol  (TENORMIN ) 25 MG tablet; Take 1/2 tablet po QPM  Dispense: 90 tablet; Refill: 1  3. Hyperlipidemia associated with type 2 diabetes mellitus (HCC) Repeat cholesterol panel - Lipid Profile  4. Atheroembolism of both lower extremities (HCC) Continue plavix  as prescribed   5. Statin myopathy Statin therapy discontinued.   6. History of recent stroke Continue plavix  as prescribed  - clopidogrel  (PLAVIX ) 75 MG tablet; Take 1 tablet (75 mg total) by mouth daily.  Dispense: 90 tablet; Refill: 1   General Counseling: Khristin verbalizes understanding of the findings of todays visit and agrees with plan of treatment. I have discussed any further diagnostic evaluation that may be needed or ordered today. We also reviewed her medications today. she has been encouraged to call the office with any questions or concerns that should arise related  to todays visit.    Orders Placed This Encounter  Procedures   Lipid Profile   Urine Microalbumin w/creat. ratio   POCT glycosylated hemoglobin (Hb A1C)    Meds ordered this encounter  Medications   clopidogrel  (PLAVIX ) 75 MG tablet    Sig: Take 1 tablet (75 mg total) by mouth daily.    Dispense:  90 tablet    Refill:  1   atenolol  (TENORMIN ) 25 MG tablet    Sig: Take 1/2 tablet po QPM    Dispense:  90 tablet    Refill:  1    For future refills   metFORMIN  (GLUCOPHAGE -XR) 500 MG 24 hr tablet    Sig: Take 1 tablet (500 mg total) by mouth daily with breakfast.    Dispense:  90 tablet    Refill:  1    Fill new script today    Return for previously scheduled, AW[facility], [facility] PCP.   Total time spent:30 Minutes Time spent includes review of chart, medications, test results, and follow up plan with the patient.   [place] Controlled Substance Database was reviewed by me.  This patient was seen by Mardy Maxin, FNP-C in collaboration with Dr. Sigrid Bathe as a part of collaborative care agreement.   Arlene Genova R. Maxin, MSN, FNP-C Internal medicine

## 2023-12-22 LAB — MICROALBUMIN / CREATININE URINE RATIO
Creatinine, Urine: 51.4 mg/dL
Microalb/Creat Ratio: 6 mg/g{creat} (ref 0–29)
Microalbumin, Urine: 3.3 ug/mL

## 2023-12-28 ENCOUNTER — Encounter: Payer: Self-pay | Admitting: Nurse Practitioner

## 2023-12-28 ENCOUNTER — Ambulatory Visit: Payer: Self-pay | Admitting: Internal Medicine

## 2023-12-28 DIAGNOSIS — Z8673 Personal history of transient ischemic attack (TIA), and cerebral infarction without residual deficits: Secondary | ICD-10-CM | POA: Insufficient documentation

## 2023-12-28 DIAGNOSIS — T466X5A Adverse effect of antihyperlipidemic and antiarteriosclerotic drugs, initial encounter: Secondary | ICD-10-CM | POA: Insufficient documentation

## 2023-12-28 DIAGNOSIS — I75023 Atheroembolism of bilateral lower extremities: Secondary | ICD-10-CM | POA: Insufficient documentation

## 2024-03-02 ENCOUNTER — Telehealth: Payer: Self-pay

## 2024-03-02 ENCOUNTER — Other Ambulatory Visit: Payer: Self-pay

## 2024-03-02 MED ORDER — AZITHROMYCIN 250 MG PO TABS: ORAL_TABLET | ORAL | 0 refills | Status: AC

## 2024-03-02 NOTE — Telephone Encounter (Signed)
 Pt called she is coughing,fatigue and sinus infection going on for 1 weeks as per alyssa sent zpak and advised not feeling better go to urgent care

## 2024-06-20 ENCOUNTER — Ambulatory Visit: Admitting: Nurse Practitioner
